# Patient Record
Sex: Female | Born: 1980 | Hispanic: No | Marital: Single | Smoking: Current every day smoker
Health system: Southern US, Community
[De-identification: ages and names within clinical notes are randomized; demographics above are authoritative.]

## PROBLEM LIST (undated history)

## (undated) DIAGNOSIS — M549 Dorsalgia, unspecified: Secondary | ICD-10-CM

## (undated) DIAGNOSIS — G43909 Migraine, unspecified, not intractable, without status migrainosus: Secondary | ICD-10-CM

## (undated) DIAGNOSIS — M5136 Other intervertebral disc degeneration, lumbar region: Secondary | ICD-10-CM

## (undated) DIAGNOSIS — G8929 Other chronic pain: Secondary | ICD-10-CM

## (undated) DIAGNOSIS — F419 Anxiety disorder, unspecified: Secondary | ICD-10-CM

## (undated) HISTORY — PX: BACK SURGERY: SHX140

## (undated) HISTORY — PX: TUBAL LIGATION: SHX77

---

## 2000-01-25 ENCOUNTER — Emergency Department (HOSPITAL_COMMUNITY): Admission: EM | Admit: 2000-01-25 | Discharge: 2000-01-25 | Payer: Self-pay | Admitting: Emergency Medicine

## 2000-03-26 ENCOUNTER — Emergency Department (HOSPITAL_COMMUNITY): Admission: EM | Admit: 2000-03-26 | Discharge: 2000-03-26 | Payer: Self-pay | Admitting: Emergency Medicine

## 2016-01-02 ENCOUNTER — Emergency Department (HOSPITAL_BASED_OUTPATIENT_CLINIC_OR_DEPARTMENT_OTHER)
Admission: EM | Admit: 2016-01-02 | Discharge: 2016-01-02 | Disposition: A | Discharge status: Home / Self Care | Payer: Medicaid - Out of State | Attending: Emergency Medicine | Admitting: Emergency Medicine

## 2016-01-02 ENCOUNTER — Emergency Department (HOSPITAL_BASED_OUTPATIENT_CLINIC_OR_DEPARTMENT_OTHER): Payer: Medicaid - Out of State

## 2016-01-02 ENCOUNTER — Encounter (HOSPITAL_BASED_OUTPATIENT_CLINIC_OR_DEPARTMENT_OTHER): Payer: Self-pay | Admitting: *Deleted

## 2016-01-02 DIAGNOSIS — R2231 Localized swelling, mass and lump, right upper limb: Secondary | ICD-10-CM | POA: Diagnosis not present

## 2016-01-02 DIAGNOSIS — F17210 Nicotine dependence, cigarettes, uncomplicated: Secondary | ICD-10-CM | POA: Diagnosis not present

## 2016-01-02 DIAGNOSIS — M25531 Pain in right wrist: Secondary | ICD-10-CM | POA: Diagnosis present

## 2016-01-02 MED ORDER — KETOROLAC TROMETHAMINE 60 MG/2ML IM SOLN
60.0000 mg | Freq: Once | INTRAMUSCULAR | Status: AC
  Administered 2016-01-02: 60 mg via INTRAMUSCULAR
  Filled 2016-01-02: qty 2

## 2016-01-02 MED ORDER — NAPROXEN 500 MG PO TABS: 500.0000 mg | ORAL_TABLET | Freq: Two times a day (BID) | ORAL | Status: DC

## 2016-01-02 NOTE — Discharge Instructions (Signed)
Call orthopedics if your wrist pain does not improve. Use the resource guide to find a primary care provider in the area. RICE (rest, ice, compression, elevation) for your wrist.   Joint Pain Joint pain, which is also called arthralgia, can be caused by many things. Joint pain often goes away when you follow your health care provider's instructions for relieving pain at home. However, joint pain can also be caused by conditions that require further treatment. Common causes of joint pain include:  Bruising in the area of the joint.  Overuse of the joint.  Wear and tear on the joints that occur with aging (osteoarthritis).  Various other forms of arthritis.  A buildup of a crystal form of uric acid in the joint (gout).  Infections of the joint (septic arthritis) or of the bone (osteomyelitis). Your health care provider may recommend medicine to help with the pain. If your joint pain continues, additional tests may be needed to diagnose your condition. HOME CARE INSTRUCTIONS Watch your condition for any changes. Follow these instructions as directed to lessen the pain that you are feeling.  Take medicines only as directed by your health care provider.  Rest the affected area for as long as your health care provider says that you should. If directed to do so, raise the painful joint above the level of your heart while you are sitting or lying down.  Do not do things that cause or worsen pain.  If directed, apply ice to the painful area:  Put ice in a plastic bag.  Place a towel between your skin and the bag.  Leave the ice on for 20 minutes, 2-3 times per day.  Wear an elastic bandage, splint, or sling as directed by your health care provider. Loosen the elastic bandage or splint if your fingers or toes become numb and tingle, or if they turn cold and blue.  Begin exercising or stretching the affected area as directed by your health care provider. Ask your health care provider what  types of exercise are safe for you.  Keep all follow-up visits as directed by your health care provider. This is important. SEEK MEDICAL CARE IF:  Your pain increases, and medicine does not help.  Your joint pain does not improve within 3 days.  You have increased bruising or swelling.  You have a fever.  You lose 10 lb (4.5 kg) or more without trying. SEEK IMMEDIATE MEDICAL CARE IF:  You are not able to move the joint.  Your fingers or toes become numb or they turn cold and blue.   This information is not intended to replace advice given to you by your health care provider. Make sure you discuss any questions you have with your health care provider.   Document Released: 11/08/2005 Document Revised: 11/29/2014 Document Reviewed: 08/20/2014 Elsevier Interactive Patient Education 2016 [employer].   Emergency Department Resource Guide 1) Find a Doctor and Pay Out of Pocket Although you won't have to find out who is covered by your insurance plan, it is a good idea to ask around and get recommendations. You will then need to call the office and see if the doctor you have chosen will accept you as a new patient and what types of options they offer for patients who are self-pay. Some doctors offer discounts or will set up payment plans for their patients who do not have insurance, but you will need to ask so you aren't surprised when you get to your appointment.  2) Contact Your Local Health Department Not all health departments have doctors that can see patients for sick visits, but many do, so it is worth a call to see if yours does. If you don't know where your local health department is, you can check in your phone book. The CDC also has a tool to help you locate your state's health department, and many state websites also have listings of all of their local health departments.  3) Find a Walk-in Clinic If your illness is not likely to be very severe or complicated, you may want to  try a walk in clinic. These are popping up all over the country in pharmacies, drugstores, and shopping centers. They're usually staffed by nurse practitioners or physician assistants that have been trained to treat common illnesses and complaints. They're usually fairly quick and inexpensive. However, if you have serious medical issues or chronic medical problems, these are probably not your best option.  No Primary Care Doctor: - Call Health Connect at  [phone] - they can help you locate a primary care doctor that  accepts your insurance, provides certain services, etc. - Physician Referral Service- [phone]  Chronic Pain Problems: Organization         Address  Phone   Notes  [facility]  [phone] Patients need to be referred by their primary care doctor.   Medication Assistance: Organization         Address  Phone   Notes  [facility] Medication [facility] [address] [place], [place] [address] [phone] --Must be a resident of [facility] -- Must have NO insurance coverage whatsoever (no Medicaid/ Medicare, etc.) -- The pt. MUST have a primary care doctor that directs their care regularly and follows them in the community   [facility]ist  [phone]   [employer]  [phone]    Agencies that provide inexpensive medical care: Organization         Address  Phone   Notes  [facility]  [phone]   [facility]    [phone]   [facility] [address] [place], [place] [address] [phone]   [facility] [address] [place], [place] [phone]   Planned Parenthood    [phone]   [facility]    [phone]   [facility]  [address], [place] Phone:  [phone], Fax:  [phone] Hours of Operation:  9 am - 6 pm, M-F.  Also accepts Medicaid/Medicare and self-pay.  [facility]  [address], [place] Phone: [phone], Fax: [phone]. Hours of Operation:  8:30 am - 5:30 pm, M-F.  Also accepts Medicaid and self-pay.  [facility] [place] [address], [place] Phone: [phone]   [facility] [address] Natasha Bence [place], [place] [phone], Ext. [phone] Mondays & Thursdays: 7-9 AM.  First 15 patients are seen on a first come, first serve basis.    Medicaid-accepting [facility] Providers:  Organization         Address  Phone   Notes  [facility] [address], [place] [phone] Also accepts self-pay patients.  [facility] [address] Laurell Josephs [place], [place]  [phone]   [facility] [address]  [place] [phone]   [place] [address], [place] [phone]   Lucianne Lei [address], [place]   [phone] Only accepts [place] Access [place] patients after they have their name applied to their card.   Self-Pay (no insurance) in [facility]:  Organization         Address  Phone   Notes  Sickle Cell Patients, [facility] Internal Medicine [place] [phone]   [facility] Urgent Care [place] [phone]   [facility] [place]  [place], [place], [place] [phone]   [facility]/Dr. Osei-Bonsu  [address], [place] or [address], [place] [phone] Phone number for both [place] and [place] locations is the same.  [facility] [address], [place] [phone]   [facility] [address], [place] or [address] [phone]   [facility] [address], [place] [phone], phone; [phone], fax  Sees patients 1st and 3rd Saturday of every month.  Must not qualify for public or private insurance (i.e. Medicaid, Medicare, [place] Health Choice, Veterans' Benefits)  Household income should be no more than 200% of the poverty level The clinic cannot treat you if you are pregnant or think you are pregnant  Sexually transmitted diseases are not treated at the clinic.    Dental Care: Organization         Address  Phone  Notes  [facility] Department of [facility] [place] [phone] Accepts children up to age 77 who are enrolled in [place] or [place]; pregnant women with a Medicaid card; and children who have applied for Medicaid or [place] Health Choice, but were declined, whose parents can pay a reduced fee at time of service.  [facility] Department of [facility]  [address], [place] [phone] Accepts children up to age 63 who are enrolled in [place] or [place]; pregnant women with a Medicaid card; and children who have applied for Medicaid or [place] Health Choice, but were declined, whose parents can pay a reduced fee at time of service.  [facility] Access PROGRAM  [place] [phone] Patients are seen by appointment only. Walk-ins are not accepted. [place] will see patients 22 years of age and older. Monday - Tuesday (8am-5pm) Most Wednesdays (8:30-5pm) $30 per visit, cash only  [facility] Adult Dental Access PROGRAM  [address], [facility] [phone] Patients are seen by appointment only. Walk-ins are not accepted. [place] will see patients 47 years of age and older. One Wednesday Evening (Monthly: Volunteer Based).  $30 per visit, cash only  [place]  [phone] for adults; Children under age 7, call Graduate Pediatric Dentistry at [phone]. Children aged 48-14, please call [phone] to  request a pediatric application.  Dental services are provided in all areas of dental care including fillings, crowns and bridges, complete and partial dentures, implants, gum treatment, root canals, and extractions. Preventive care is also provided. Treatment is provided to both adults and children. Patients are selected via a lottery and there is often a waiting list.   [facility] [address],  [place]  [phone] [email or web address]   [facility] [address] [place], [place] [phone], Ext. [phone] Second and Fourth Thursday of each month, opens at 6:30 AM; Clinic ends at 9 AM.  Patients are seen on a first-come first-served basis, and a limited number are seen during each clinic.   [facility]  [address] Hillard Danker [place], [place] [phone]   Eligibility Requirements You must have lived in [place], [place], or [place] counties for at least the last three months.   You cannot be eligible for state or federal sponsored [employer], including [employer], [place], or [employer].   You generally cannot be eligible for healthcare insurance through your employer.    How to apply: Eligibility screenings are held every Tuesday and Wednesday afternoon from 1:00 pm until 4:00 pm. You do not need an appointment for the interview!  [facility] [address] [place], [place]   [place]  [place] Department  [place]  [phone]    Behavioral Health Resources in the Community: Intensive Outpatient Programs Organization         Address  Phone  Notes  [place]. [address], [place], [place] [phone]   [facility] Outpatient [address], [place], [place]   ADS: [facility] Drug S[facility] [address], [place], [place]   [place] [address]  [place], [place] or [phone]   Substance Abuse Resources Organization         Address  Phone  Notes  Alcohol and Drug Services  [phone]   [place]  [phone]   [facility] [place]   Chinita Pester  [phone]   Residential & Outpatient Substance Abuse Program  [phone]   Psychological Services Organization         Address  Phone  Notes  [facility] [place]  [place]  [phone]   [place] [address] [employer] or [phone]    Mobile Crisis Teams Organization         Address  Phone  Notes  [facility], Mobile Crisis Care Unit  [phone]   [facility]  [address] [place], [place]   [place] Levels [address] [place] [phone]    Self-Help/Support Groups Organization         Address  Phone             Notes  [place]. of [place] - variety of support groups  [place] Call for more information  Narcotics Anonymous (NA), [facility] [address], [employer] [place]  2 meetings at this location   Special educational needs teacher         Address  Phone  Notes  [facility] [place],    [place]  [phone]   [facility]  [address] [place] [phone], [place], [place]   [place], [place] [phone] Admissions: 8am-3pm M-F  Incentives Substance [place] [address],    [place], [place] [phone]   [facility] [address] [place], [place], [place]   [facility] [address]  [place], [place]   [facility]ight  Programs - Intensive Outpatient [address] Krist[address], [place], [place]   [facility] ([address]  [place], [place] [phone] or [phone]   [facility] (RTS) [address] [place], [place] Accepts Medicaid  [facility] [place] [address],  [place] [phone] Substance Abuse/Addiction Treatment   [facility] Organization         Address  Phone  Notes  [facility]  [phone]   Domenic Schwab, PhD [address] Arlis Porta [place], [place]   [phone] or [phone]   Lostine Hardtner Donnelly [place], [place] [phone]   [place] [address], [place], [place] [phone] Insurance/Medicaid/sponsorship through [facility] and Families [address]                                    [place], [place] [phone] [place] [address] [place], [place] [phone]    Dr. Adele Schilder  [phone]   [facility] Dept. 1) [address], [place] 2) [place] 3)  [address], [place] [phone]  [phone]   [place] [phone] or [phone] (After Hours)

## 2016-01-02 NOTE — ED Provider Notes (Signed)
CSN: [id number]     Arrival date & time 01/02/16  1825 History   First MD Initiated Contact with Patient 01/02/16 1837     Chief Complaint  Patient presents with  . Wrist Pain   Patient is a 35 y.o. female presenting with wrist pain. The history is provided by the patient.  Wrist Pain This is a new problem. The current episode started yesterday. The problem occurs constantly. The problem has been unchanged. Associated symptoms include arthralgias and joint swelling. Pertinent negatives include no numbness. The symptoms are aggravated by bending. She has tried acetaminophen for the symptoms. The treatment provided no relief.   Abigail Nixon is a 35 year old female presenting with wrist pain. Onset of symptoms was 2 days ago. She states that she drove from [place] to [place] 2 days ago when the wrist pain started. She states that she is holding the steering wheel with her right hand the entire drive. She is not complaining of severe, sharp wrist pain over the ulnar aspect. Pain is exacerbated by movement of the wrist. Pain is somewhat alleviated by holding the wrist still and resting it. She has tried Tylenol without relief. She notes associated wrist swelling. She denies direct injury to the wrist. She denies numbness, tingling, weakness or loss of sensation in the right hand. She denies pain in the digits or elbow. She has no other complaints today.  History reviewed. No pertinent past medical history. Past Surgical History  Procedure Laterality Date  . Back surgery     No family history on file. Social History  Substance Use Topics  . Smoking status: Current Every Day Smoker -- 1.00 packs/day    Types: Cigarettes  . Smokeless tobacco: None  . Alcohol Use: No   OB History    No data available     Review of Systems  Musculoskeletal: Positive for joint swelling and arthralgias.  Neurological: Negative for numbness.  All other systems reviewed and are negative.     Allergies   Review of patient's allergies indicates no known allergies.  Home Medications   Prior to Admission medications   Medication Sig Start Date End Date Taking? Authorizing Provider  naproxen (NAPROSYN) 500 MG tablet Take 1 tablet (500 mg total) by mouth 2 (two) times daily. 01/02/16   Rosibel Giacobbe, PA-C   BP 136/82 mmHg  Pulse 70  Temp(Src) 98.2 F (36.8 C) (Oral)  Resp 20  Ht  (1.676 m)  Wt 99.791 kg  BMI 35.53 kg/m2  SpO2 100%  LMP 12/27/2015 Physical Exam  Constitutional: She appears well-developed and well-nourished. No distress.  HENT:  Head: Normocephalic and atraumatic.  Eyes: Conjunctivae are normal. Right eye exhibits no discharge. Left eye exhibits no discharge. No scleral icterus.  Neck: Normal range of motion.  Cardiovascular: Normal rate, regular rhythm and intact distal pulses.   Pulmonary/Chest: Effort normal. No respiratory distress.  Musculoskeletal:       Right wrist: She exhibits decreased range of motion and tenderness. She exhibits no swelling and no deformity.  Generalized tenderness of the right wrist. Tenderness does not extend into the hand or forearm. Decreased range of motion secondary to pain. No obvious swelling or deformity. Full range of motion of the digits and elbow intact. Radial pulse palpable.  Neurological: She is alert. Coordination normal.  Patient refuses strength testing of the right hand secondary to pain. Sensation to light touch intact over the right arm  Skin: Skin is warm and dry.  Psychiatric: She has a normal mood and affect. Her behavior is normal.  Nursing note and vitals reviewed.   ED Course  Procedures (including critical care time) Labs Review Labs Reviewed - No data to display  Imaging Review Dg Wrist Complete Right  01/02/2016  CLINICAL DATA:  Right wrist pain for 2 days.  No known injury. EXAM: RIGHT WRIST - COMPLETE 3+ VIEW COMPARISON:  None. FINDINGS: There is no evidence of fracture or dislocation. There is no  evidence of arthropathy or other focal bone abnormality. Soft tissues are unremarkable. IMPRESSION: Negative. Electronically Signed   By: Bary Richard M.D.   On: 01/02/2016 18:52   I have personally reviewed and evaluated these images and lab results as part of my medical decision-making.   EKG Interpretation None      MDM   Final diagnoses:  Right wrist pain   Patient presenting with atraumatic right wrist pain after a long drive. Right hand is neurovascularly intact. Restricted ROM secondary to pain. Patient X-Ray negative for obvious fracture or dislocation. Pain managed in ED with toradol. Brace given and conservative therapy recommended. Discussed RICE therapy and use of OTC pain relievers. Pt advised to follow up with orthopedics if symptoms persist. Return precautions discussed at bedside and given in discharge paperwork. Pt is stable for discharge.     Alveta Heimlich, PA-C 01/02/16 1953  Glynn Octave, MD 01/02/16 2004

## 2016-01-02 NOTE — ED Notes (Signed)
Right wrist pain x 2 days. No known injury.

## 2016-03-05 ENCOUNTER — Encounter (HOSPITAL_BASED_OUTPATIENT_CLINIC_OR_DEPARTMENT_OTHER): Payer: Self-pay | Admitting: *Deleted

## 2016-03-05 ENCOUNTER — Emergency Department (HOSPITAL_BASED_OUTPATIENT_CLINIC_OR_DEPARTMENT_OTHER)
Admission: EM | Admit: 2016-03-05 | Discharge: 2016-03-05 | Disposition: A | Discharge status: Home / Self Care | Payer: Medicaid - Out of State | Attending: Emergency Medicine | Admitting: Emergency Medicine

## 2016-03-05 DIAGNOSIS — F17210 Nicotine dependence, cigarettes, uncomplicated: Secondary | ICD-10-CM | POA: Insufficient documentation

## 2016-03-05 DIAGNOSIS — N764 Abscess of vulva: Secondary | ICD-10-CM | POA: Diagnosis present

## 2016-03-05 MED ORDER — LIDOCAINE-EPINEPHRINE 2 %-1:100000 IJ SOLN
INTRAMUSCULAR | Status: AC
  Administered 2016-03-05: 10 mL
  Filled 2016-03-05: qty 1

## 2016-03-05 MED ORDER — OXYCODONE-ACETAMINOPHEN 5-325 MG PO TABS: 1.0000 | ORAL_TABLET | ORAL | Status: DC | PRN

## 2016-03-05 MED ORDER — OXYCODONE-ACETAMINOPHEN 5-325 MG PO TABS
1.0000 | ORAL_TABLET | Freq: Once | ORAL | Status: AC
  Administered 2016-03-05: 1 via ORAL
  Filled 2016-03-05: qty 1

## 2016-03-05 MED ORDER — LIDOCAINE-EPINEPHRINE (PF) 2 %-1:200000 IJ SOLN: 10.0000 mL | Freq: Once | INTRAMUSCULAR | Status: DC

## 2016-03-05 MED ORDER — CEPHALEXIN 500 MG PO CAPS: 500.0000 mg | ORAL_CAPSULE | Freq: Four times a day (QID) | ORAL | Status: DC

## 2016-03-05 MED ORDER — IBUPROFEN 800 MG PO TABS: 800.0000 mg | ORAL_TABLET | Freq: Three times a day (TID) | ORAL | Status: DC

## 2016-03-05 NOTE — ED Provider Notes (Signed)
CSN: [id number]     Arrival date & time 03/05/16  2028 History   First MD Initiated Contact with Patient 03/05/16 2041     Chief Complaint  Patient presents with  . Abscess     (Consider location/radiation/quality/duration/timing/severity/associated sxs/prior Treatment) Patient is a 35 y.o. female presenting with abscess. The history is provided by the patient. No language interpreter was used.  Abscess Location:  Ano-genital Ano-genital abscess location:  Vagina Abscess quality: painful and redness   Abscess quality: not draining   Red streaking: no   Duration:  5 days Progression:  Worsening Chronicity:  Recurrent Associated symptoms: no fever   Associated symptoms comment:  Patient with a history of recurrent cutaneous abscesses presents with painful swelling like her boils to left labia. No drainage or fever. She states the pain makes her nauseous but she denies vomiting. No vaginal discharge or dysuria.    History reviewed. No pertinent past medical history. Past Surgical History  Procedure Laterality Date  . Back surgery    . Tubal ligation     No family history on file. Social History  Substance Use Topics  . Smoking status: Current Every Day Smoker -- 1.00 packs/day    Types: Cigarettes  . Smokeless tobacco: None  . Alcohol Use: No   OB History    No data available     Review of Systems  Constitutional: Negative for fever.  Gastrointestinal: Negative for abdominal pain.  Genitourinary: Positive for vaginal pain.  Musculoskeletal: Negative for myalgias.  Skin: Negative for color change.       C/O recurrent boil to left labia majora.      Allergies  Review of patient's allergies indicates no known allergies.  Home Medications   Prior to Admission medications   Not on File   BP 151/87 mmHg  Pulse 76  Temp(Src) 98.9 F (37.2 C)  Resp 16  Ht 5\' 6"  (1.676 m)  Wt 102.059 kg  BMI 36.33 kg/m2  SpO2 99%  LMP 02/21/2016 Physical Exam   Constitutional: She is oriented to person, place, and time. She appears well-developed and well-nourished.  Neck: Normal range of motion.  Pulmonary/Chest: Effort normal.  Abdominal: There is no tenderness.  Genitourinary:  Large indurated cyst to upper labia majora on left. No fluctuance, no redness.   Neurological: She is alert and oriented to person, place, and time.  Skin: Skin is warm and dry.    ED Course  Procedures (including critical care time) Labs Review Labs Reviewed - No data to display  Imaging Review No results found. I have personally reviewed and evaluated these images and lab results as part of my medical decision-making.   EKG Interpretation None     INCISION AND DRAINAGE Performed by: Elpidio AnisUPSTILL, Natalya Domzalski A Consent: Verbal consent obtained. Risks and benefits: risks, benefits and alternatives were discussed Type: abscess  Body area: left outer labia  Anesthesia: local infiltration  Incision was made with a #11 blade scalpel.  Local anesthetic: lidocaine 2% w/epinephrine  Anesthetic total: 2 ml  Complexity: complex Blunt dissection to break up loculations  Drainage: purulent  Drainage amount: none  Packing material: none  Patient tolerance: Patient tolerated the procedure well with no immediate complications.    MDM   Final diagnoses:  None    1. Labial abscess  Discussed with the patient that the boil may not be amenable to drainage given no redness or fluctuance. The patient prefers attempt at I&D which was performed. No drainage with incision. Will  place on abx, recommend warm compresses and 2 day follow up. Pain mgmt provided.     Elpidio Anis, PA-C 03/05/16 2202  Alvira Monday, MD 03/07/16 [phone]

## 2016-03-05 NOTE — ED Notes (Signed)
Discharge vitals taken, BP elevated patient states she is in a lot of pain but that is normal history of abcesses.

## 2016-03-05 NOTE — ED Notes (Signed)
Pt c/o abscess to labia x 1 week

## 2016-03-05 NOTE — Discharge Instructions (Signed)
Incision and Drainage °Incision and drainage is a procedure in which a sac-like structure (cystic structure) is opened and drained. The area to be drained usually contains material such as pus, fluid, or blood.  °LET YOUR CAREGIVER KNOW ABOUT:  °· Allergies to medicine. °· Medicines taken, including vitamins, herbs, eyedrops, over-the-counter medicines, and creams. °· Use of steroids (by mouth or creams). °· Previous problems with anesthetics or numbing medicines. °· History of bleeding problems or blood clots. °· Previous surgery. °· Other health problems, including diabetes and kidney problems. °· Possibility of pregnancy, if this applies. °RISKS AND COMPLICATIONS °· Pain. °· Bleeding. °· Scarring. °· Infection. °BEFORE THE PROCEDURE  °You may need to have an ultrasound or other imaging tests to see how large or deep your cystic structure is. Blood tests may also be used to determine if you have an infection or how severe the infection is. You may need to have a tetanus shot. °PROCEDURE  °The affected area is cleaned with a cleaning fluid. The cyst area will then be numbed with a medicine (local anesthetic). A small incision will be made in the cystic structure. A syringe or catheter may be used to drain the contents of the cystic structure, or the contents may be squeezed out. The area will then be flushed with a cleansing solution. After cleansing the area, it is often gently packed with a gauze or another wound dressing. Once it is packed, it will be covered with gauze and tape or some other type of wound dressing.  °AFTER THE PROCEDURE  °· Often, you will be allowed to go home right after the procedure. °· You may be given antibiotic medicine to prevent or heal an infection. °· If the area was packed with gauze or some other wound dressing, you will likely need to come back in 1 to 2 days to get it removed. °· The area should heal in about 14 days. °  °This information is not intended to replace advice given  to you by your health care provider. Make sure you discuss any questions you have with your health care provider. °  °Document Released: 05/04/2001 Document Revised: 05/09/2012 Document Reviewed: 01/03/2012 °Elsevier Interactive Patient Education ©2016 [employer] °Heat Therapy °Heat therapy can help ease sore, stiff, injured, and tight muscles and joints. Heat relaxes your muscles, which may help ease your pain.  °RISKS AND COMPLICATIONS °If you have any of the following conditions, do not use heat therapy unless your health care provider has approved: °· Poor circulation. °· Healing wounds or scarred skin in the area being treated. °· Diabetes, heart disease, or high blood pressure. °· Not being able to feel (numbness) the area being treated. °· Unusual swelling of the area being treated. °· Active infections. °· Blood clots. °· Cancer. °· Inability to communicate pain. This may include young children and people who have problems with their brain function (dementia). °· Pregnancy. °Heat therapy should only be used on old, pre-existing, or long-lasting (chronic) injuries. Do not use heat therapy on new injuries unless directed by your health care provider. °HOW TO USE HEAT THERAPY °There are several different kinds of heat therapy, including: °· Moist heat pack. °· Warm water bath. °· Hot water bottle. °· Electric heating pad. °· Heated gel pack. °· Heated wrap. °· Electric heating pad. °Use the heat therapy method suggested by your health care provider. Follow your health care provider's instructions on when and how to use heat therapy. °GENERAL HEAT THERAPY RECOMMENDATIONS °· Do   not sleep while using heat therapy. Only use heat therapy while you are awake. °· Your skin may turn pink while using heat therapy. Do not use heat therapy if your skin turns red. °· Do not use heat therapy if you have new pain. °· High heat or long exposure to heat can cause burns. Be careful when using heat therapy to avoid burning  your skin. °· Do not use heat therapy on areas of your skin that are already irritated, such as with a rash or sunburn. °SEEK MEDICAL CARE IF: °· You have blisters, redness, swelling, or numbness. °· You have new pain. °· Your pain is worse. °MAKE SURE YOU: °· Understand these instructions. °· Will watch your condition. °· Will get help right away if you are not doing well or get worse. °  °This information is not intended to replace advice given to you by your health care provider. Make sure you discuss any questions you have with your health care provider. °  °Document Released: 01/31/2012 Document Revised: 11/29/2014 Document Reviewed: 01/01/2014 °Elsevier Interactive Patient Education ©2016 [employer]vier Inc. ° °

## 2016-03-08 ENCOUNTER — Emergency Department (HOSPITAL_BASED_OUTPATIENT_CLINIC_OR_DEPARTMENT_OTHER)
Admission: EM | Admit: 2016-03-08 | Discharge: 2016-03-08 | Disposition: A | Discharge status: Home / Self Care | Payer: Medicaid - Out of State | Attending: Emergency Medicine | Admitting: Emergency Medicine

## 2016-03-08 ENCOUNTER — Encounter (HOSPITAL_BASED_OUTPATIENT_CLINIC_OR_DEPARTMENT_OTHER): Payer: Self-pay

## 2016-03-08 DIAGNOSIS — N764 Abscess of vulva: Secondary | ICD-10-CM | POA: Diagnosis not present

## 2016-03-08 DIAGNOSIS — F17210 Nicotine dependence, cigarettes, uncomplicated: Secondary | ICD-10-CM | POA: Insufficient documentation

## 2016-03-08 DIAGNOSIS — Z792 Long term (current) use of antibiotics: Secondary | ICD-10-CM | POA: Insufficient documentation

## 2016-03-08 DIAGNOSIS — R102 Pelvic and perineal pain: Secondary | ICD-10-CM | POA: Diagnosis present

## 2016-03-08 DIAGNOSIS — Z791 Long term (current) use of non-steroidal anti-inflammatories (NSAID): Secondary | ICD-10-CM | POA: Insufficient documentation

## 2016-03-08 MED ORDER — LIDOCAINE HCL 1 % IJ SOLN
INTRAMUSCULAR | Status: AC
  Administered 2016-03-08: 20 mL
  Filled 2016-03-08: qty 20

## 2016-03-08 MED ORDER — SULFAMETHOXAZOLE-TRIMETHOPRIM 800-160 MG PO TABS: 1.0000 | ORAL_TABLET | Freq: Two times a day (BID) | ORAL | Status: AC

## 2016-03-08 MED ORDER — OXYCODONE-ACETAMINOPHEN 5-325 MG PO TABS: 1.0000 | ORAL_TABLET | Freq: Four times a day (QID) | ORAL | Status: DC | PRN

## 2016-03-08 MED ORDER — MORPHINE SULFATE (PF) 4 MG/ML IV SOLN
4.0000 mg | Freq: Once | INTRAVENOUS | Status: AC
  Administered 2016-03-08: 4 mg via INTRAMUSCULAR
  Filled 2016-03-08: qty 1

## 2016-03-08 NOTE — ED Notes (Signed)
Pa  at bedside. 

## 2016-03-08 NOTE — ED Provider Notes (Signed)
CSN: [id number]     Arrival date & time 03/08/16  1702 History   First MD Initiated Contact with Patient 03/08/16 1921     Chief Complaint  Patient presents with  . Cyst     (Consider location/radiation/quality/duration/timing/severity/associated sxs/prior Treatment) The history is provided by the patient and medical records. No language interpreter was used.   Claudean Kindsheresa Petrovich is a 35 y.o. female  with a PMH of multiple skin abscesses in various locations who presents to the Emergency Department complaining of a worsening vaginal abscess with associated vaginal pain x 8-9 days. Patient was seen in ED on 4/14 (3 days ago) for the same where an I&D was performed but appears no drainage with procedure. She was started on Keflex which she has been compliant with, however area has worsened. Today, she noticed purulent drainage from the site. Patient states short course of pain medication was given at last ED visit, however she has ran out and has taken nothing else for the pain today. No alleviating or aggravating factors noted. Patient denies fever, vaginal discharge, vaginal bleeding.   History reviewed. No pertinent past medical history. Past Surgical History  Procedure Laterality Date  . Back surgery    . Tubal ligation     No family history on file. Social History  Substance Use Topics  . Smoking status: Current Every Day Smoker -- 1.00 packs/day    Types: Cigarettes  . Smokeless tobacco: None  . Alcohol Use: No   OB History    No data available     Review of Systems  Constitutional: Negative for fever.  Genitourinary: Positive for vaginal pain. Negative for vaginal bleeding and vaginal discharge.  Skin: Positive for wound.   10 Systems reviewed and are negative for acute change except as noted in the HPI.   Allergies  Review of patient's allergies indicates no known allergies.  Home Medications   Prior to Admission medications   Medication Sig Start Date End Date Taking?  Authorizing Provider  cephALEXin (KEFLEX) 500 MG capsule Take 1 capsule (500 mg total) by mouth 4 (four) times daily. 03/05/16   Elpidio AnisShari Upstill, PA-C  ibuprofen (ADVIL,MOTRIN) 800 MG tablet Take 1 tablet (800 mg total) by mouth 3 (three) times daily. 03/05/16   Elpidio AnisShari Upstill, PA-C  oxyCODONE-acetaminophen (PERCOCET/ROXICET) 5-325 MG tablet Take 1 tablet by mouth every 6 (six) hours as needed for severe pain. 03/08/16   Chase PicketJaime Pilcher Alexus Michael, PA-C  sulfamethoxazole-trimethoprim (BACTRIM DS,SEPTRA DS) 800-160 MG tablet Take 1 tablet by mouth 2 (two) times daily. 03/08/16 03/15/16  Dane Kopke Pilcher Rayshard Schirtzinger, PA-C   BP 121/68 mmHg  Pulse 89  Temp(Src) 98.8 F (37.1 C) (Oral)  Resp 16  Ht 5\' 6"  (1.676 m)  Wt 102.059 kg  BMI 36.33 kg/m2  SpO2 97%  LMP 02/21/2016 Physical Exam  Constitutional: She is oriented to person, place, and time. She appears well-developed and well-nourished.  Alert, appears in pain, but in no acute distress.  HENT:  Head: Normocephalic and atraumatic.  Cardiovascular: Normal rate, regular rhythm and normal heart sounds.  Exam reveals no gallop and no friction rub.   No murmur heard. Pulmonary/Chest: Effort normal and breath sounds normal. No respiratory distress. She has no wheezes. She has no rales.  Abdominal: Soft. She exhibits no distension and no mass. There is no tenderness. There is no rebound and no guarding.  Genitourinary:  Upper left labia majora with erythematous area of fluctuance slightly draining purulent fluid with a large amount of induration without  erythema surrounding.   Neurological: She is alert and oriented to person, place, and time.  Skin: Skin is warm and dry.  Nursing note and vitals reviewed.   ED Course  Procedures (including critical care time)  INCISION AND DRAINAGE Performed by: Chase Picket Dhyana Bastone Consent: Verbal consent obtained. Risks and benefits: risks, benefits and alternatives were discussed Type: abscess Body area: left outer  labia Anesthesia: local infiltration Incision was made with a scalpel. Local anesthetic: lidocaine 1%  Anesthetic total: 1 ml - Patient asked me to quit local Anesthetic infiltrate secondary to pain, therefore did not fully anesthetize the area and only 1 mL was used. Complexity: complex Blunt dissection to break up loculations Drainage: purulent Drainage amount: Large Packing material: I recommended packing, however patient would not allow me to put packing in place. Recommended again, patient still declined.  Patient tolerance: Patient tolerated the procedure well with no immediate complications.   Labs Review Labs Reviewed - No data to display  Imaging Review No results found. I have personally reviewed and evaluated these images and lab results as part of my medical decision-making.   EKG Interpretation None      MDM   Final diagnoses:  Left genital labial abscess   Monetta Lick is a 35 y.o. female who presents to ED with vaginal abscess. On exam, she has a large area of fluctuance on the left outer labia which needs to be drained. I and D was performed as dictated above with a large amount of drainage. Patient was placed on Keflex when seen for the same 3 days ago. Will switch antibiotic to Bactrim. Patient was given referral to GYN. Return precautions were given, home care instructions discussed, and all questions were answered.  Patient discussed with Dr. Karma Ganja who agrees with treatment plan.   [facility] Zerick Prevette, PA-C 03/09/16 4098  Jerelyn Scott, MD 03/09/16 [phone]

## 2016-03-08 NOTE — Discharge Instructions (Signed)
You need to follow up with the surgeon listed at the next available appointment, preferably in the next 3 days. Take pain medication only as needed for severe pain - This can make you very drowsy - please do not drink or drive on this medication Return to ER for fever, any new or worsening symptoms, any additional concerns.

## 2016-03-08 NOTE — ED Notes (Signed)
C/o vaginal "cyst"-seen here 3 days ago for same

## 2016-09-03 ENCOUNTER — Ambulatory Visit (HOSPITAL_COMMUNITY)
Admission: EM | Admit: 2016-09-03 | Discharge: 2016-09-03 | Disposition: A | Discharge status: Other Acute Inpatient Hospital | Payer: Medicaid - Out of State

## 2016-09-09 ENCOUNTER — Emergency Department (HOSPITAL_COMMUNITY): Payer: Medicaid Other

## 2016-09-09 ENCOUNTER — Encounter (HOSPITAL_COMMUNITY): Payer: Self-pay | Admitting: Emergency Medicine

## 2016-09-09 ENCOUNTER — Emergency Department (HOSPITAL_COMMUNITY)
Admission: EM | Admit: 2016-09-09 | Discharge: 2016-09-09 | Disposition: A | Discharge status: Home / Self Care | Payer: Medicaid Other | Attending: Emergency Medicine | Admitting: Emergency Medicine

## 2016-09-09 DIAGNOSIS — Y929 Unspecified place or not applicable: Secondary | ICD-10-CM | POA: Insufficient documentation

## 2016-09-09 DIAGNOSIS — S7001XA Contusion of right hip, initial encounter: Secondary | ICD-10-CM | POA: Insufficient documentation

## 2016-09-09 DIAGNOSIS — W19XXXA Unspecified fall, initial encounter: Secondary | ICD-10-CM

## 2016-09-09 DIAGNOSIS — W0110XA Fall on same level from slipping, tripping and stumbling with subsequent striking against unspecified object, initial encounter: Secondary | ICD-10-CM | POA: Diagnosis not present

## 2016-09-09 DIAGNOSIS — F17210 Nicotine dependence, cigarettes, uncomplicated: Secondary | ICD-10-CM | POA: Insufficient documentation

## 2016-09-09 DIAGNOSIS — S79911A Unspecified injury of right hip, initial encounter: Secondary | ICD-10-CM | POA: Diagnosis present

## 2016-09-09 DIAGNOSIS — M542 Cervicalgia: Secondary | ICD-10-CM | POA: Insufficient documentation

## 2016-09-09 DIAGNOSIS — K029 Dental caries, unspecified: Secondary | ICD-10-CM | POA: Insufficient documentation

## 2016-09-09 DIAGNOSIS — Y939 Activity, unspecified: Secondary | ICD-10-CM | POA: Insufficient documentation

## 2016-09-09 DIAGNOSIS — G8929 Other chronic pain: Secondary | ICD-10-CM | POA: Insufficient documentation

## 2016-09-09 DIAGNOSIS — Y999 Unspecified external cause status: Secondary | ICD-10-CM | POA: Insufficient documentation

## 2016-09-09 DIAGNOSIS — M545 Low back pain: Secondary | ICD-10-CM | POA: Diagnosis not present

## 2016-09-09 DIAGNOSIS — K0889 Other specified disorders of teeth and supporting structures: Secondary | ICD-10-CM

## 2016-09-09 HISTORY — DX: Other intervertebral disc degeneration, lumbar region: M51.36

## 2016-09-09 HISTORY — DX: Migraine, unspecified, not intractable, without status migrainosus: G43.909

## 2016-09-09 LAB — POC URINE PREG, ED: Preg Test, Ur: NEGATIVE

## 2016-09-09 MED ORDER — HYDROMORPHONE HCL 1 MG/ML IJ SOLN
1.0000 mg | Freq: Once | INTRAMUSCULAR | Status: AC
  Administered 2016-09-09: 1 mg via INTRAVENOUS
  Filled 2016-09-09: qty 1

## 2016-09-09 MED ORDER — DIAZEPAM 5 MG/ML IJ SOLN
5.0000 mg | Freq: Once | INTRAMUSCULAR | Status: AC
  Administered 2016-09-09: 5 mg via INTRAVENOUS
  Filled 2016-09-09: qty 2

## 2016-09-09 MED ORDER — OXYCODONE-ACETAMINOPHEN 10-325 MG PO TABS: 1.0000 | ORAL_TABLET | ORAL | 0 refills | Status: AC | PRN

## 2016-09-09 MED ORDER — PENICILLIN V POTASSIUM 500 MG PO TABS: 500.0000 mg | ORAL_TABLET | Freq: Four times a day (QID) | ORAL | 0 refills | Status: AC

## 2016-09-09 MED ORDER — LIDOCAINE VISCOUS 2 % MT SOLN: 15.0000 mL | OROMUCOSAL | 0 refills | Status: DC | PRN

## 2016-09-09 MED ORDER — LIDOCAINE VISCOUS 2 % MT SOLN
15.0000 mL | Freq: Once | OROMUCOSAL | Status: AC
  Administered 2016-09-09: 15 mL via OROMUCOSAL
  Filled 2016-09-09: qty 15

## 2016-09-09 NOTE — ED Notes (Signed)
PT state she is in pain and need to see a MD. Info PT I will ask RN if any med have been order. PT went back in rm to close door

## 2016-09-09 NOTE — ED Provider Notes (Signed)
[facility]-EMERGENCY DEPT Provider Note   CSN: [id number] Arrival date & time: 09/09/16  1052     History   Chief Complaint Chief Complaint  Patient presents with  . Multiple Complaints    HPI Abigail Nixon is a 35 y.o. female.  HPI  Abigail Nixon is a 35 y.o. female with PMH significant for chronic back pain with previous back surgeries who presents for evaluation of fall.  Patient is disabled due to her back pain.  Currently taking percocet 10-325 mg, but ran out over the last couple of days.  Recently moved here from [place] 2 months ago.  Ambulates with cane.  She states she was in the shower and lost her balance causing her to fall, landing on her right hip and striking the right side of her head.  Unsure LOC.  Denies preceding symptoms including CP, SOB, dizziness, or syncope.  Denies fever, chills, diplopia, N/V, numbness, weakness, b/b incontinence.  She states she has had tingling in left arm to all fingertips for the last 3 weeks.  [facility]. She also complains of right upper and lower dental pain x 1 week.  Has been using oragel with relief.  She does not have a dentist. No tongue swelling, anterior neck pain/swelling.  Mild facial swelling.   Past Medical History:  Diagnosis Date  . DDD (degenerative disc disease), lumbar   . Migraine     There are no active problems to display for this patient.   Past Surgical History:  Procedure Laterality Date  . BACK SURGERY    . TUBAL LIGATION      OB History    No data available       Home Medications    Prior to Admission medications   Medication Sig Start Date End Date Taking? Authorizing Provider  albuterol (PROVENTIL HFA;VENTOLIN HFA) 108 (90 Base) MCG/ACT inhaler Inhale 1-2 puffs into the lungs every 6 (six) hours as needed for wheezing or shortness of breath.    Yes Historical Provider, MD  albuterol (PROVENTIL) (2.5 MG/3ML) 0.083% nebulizer solution Take 2.5 mg by  nebulization every 6 (six) hours as needed for wheezing or shortness of breath.   Yes Historical Provider, MD  lidocaine (XYLOCAINE) 2 % solution Use as directed 15 mLs in the mouth or throat as needed for mouth pain. 09/09/16   Cheri FowlerKayla Tradarius Reinwald, PA-C  oxyCODONE-acetaminophen (PERCOCET) 10-325 MG tablet Take 1 tablet by mouth every 4 (four) hours as needed for pain. 09/09/16   Cheri FowlerKayla Anna Livers, PA-C  penicillin v potassium (VEETID) 500 MG tablet Take 1 tablet (500 mg total) by mouth 4 (four) times daily. 09/09/16 09/16/16  Cheri FowlerKayla Caramia Boutin, PA-C    Family History History reviewed. No pertinent family history.  Social History Social History  Substance Use Topics  . Smoking status: Current Every Day Smoker    Packs/day: 1.00    Types: Cigarettes  . Smokeless tobacco: Never Used  . Alcohol use No     Allergies   Review of patient's allergies indicates no known allergies.   Review of Systems Review of Systems   Physical Exam Updated Vital Signs BP 125/87   Pulse 65   Temp 98.9 F (37.2 C) (Oral)   Resp 16   LMP 08/26/2016 (Approximate)   SpO2 98%   Physical Exam  Constitutional: She is oriented to person, place, and time. She appears well-developed and well-nourished.  Non-toxic appearance. She does not have a sickly appearance. She does not  appear ill.  Patient extremely tearful and anxious on exam.   HENT:  Head: Normocephalic and atraumatic.  Mouth/Throat: Uvula is midline, oropharynx is clear and moist and mucous membranes are normal. No trismus in the jaw. Abnormal dentition. Dental caries present.  Right parietal scalp TTP without crepitus or hematoma. Multiple missing teeth.  No obvious dental abscess.  Mild left sided facial swelling with mild maxilla and mandibular tenderness. No trismus.  No signs of Ludwig angina.   Eyes: Conjunctivae are normal. Pupils are equal, round, and reactive to light.  Neck: Normal range of motion. Neck supple.  Diffuse cervical midline tenderness.     Cardiovascular: Normal rate, regular rhythm and normal heart sounds.   Pulmonary/Chest: Effort normal and breath sounds normal. No accessory muscle usage or stridor. No respiratory distress. She has no wheezes. She has no rhonchi. She has no rales.  Abdominal: Soft. Bowel sounds are normal. She exhibits no distension. There is no tenderness.  Musculoskeletal: Normal range of motion. She exhibits tenderness.       Right hip: She exhibits tenderness. She exhibits normal range of motion and normal strength.  Moves all extremities spontaneously.  Lymphadenopathy:    She has no cervical adenopathy.  Neurological: She is alert and oriented to person, place, and time.  Speech clear without dysarthria. Cranial nerves grossly intact. Normal strength and sensation intact bilaterally throughout upper and lower extremities.  Normal gait with cane.  Skin: Skin is warm and dry.  Well healed lumbar surgical scar without signs of infection.  Ecchymosis to right lateral hip.   Psychiatric: She has a normal mood and affect. Her behavior is normal.     ED Treatments / Results  Labs (all labs ordered are listed, but only abnormal results are displayed) Labs Reviewed  POC URINE PREG, ED    EKG  EKG Interpretation None       Radiology Ct Head Wo Contrast  Result Date: 09/09/2016 CLINICAL DATA:  Fall, head injury EXAM: CT HEAD WITHOUT CONTRAST CT CERVICAL SPINE WITHOUT CONTRAST TECHNIQUE: Multidetector CT imaging of the head and cervical spine was performed following the standard protocol without intravenous contrast. Multiplanar CT image reconstructions of the cervical spine were also generated. COMPARISON:  None. FINDINGS: CT HEAD FINDINGS Brain: No intracranial hemorrhage, mass effect or midline shift. No acute cortical infarction. No hydrocephalus. The gray and white-matter differentiation is preserved. No mass lesion is noted on this unenhanced scan. Vascular: No hyperdense vessel or unexpected  calcification. Skull: Normal. Negative for fracture or focal lesion. Sinuses/Orbits: No acute finding. Other: None CT CERVICAL SPINE FINDINGS Alignment: Mild reversal of cervical lordosis. Skull base and vertebrae: No acute fracture or subluxation. Mild disc space flattening with anterior spurring at C5-C6 and C6-C7 level. Mild anterior spurring at T1-T2 and T2-T3 level. Soft tissues and spinal canal: No prevertebral soft tissue swelling. Spinal canal is patent. Disc levels: Mild disc space flattening with anterior spurring at C5-C6 and C6-C7 level. Upper chest: Visualized lung apices shows no evidence of pneumothorax. Other: There is old fracture deformity of proximal left clavicle. There is probable chronic subluxation and depression of proximal left clavicle from sternoclavicular joint. Please see axial image 75. IMPRESSION: 1. No acute intracranial abnormality. 2. No cervical spine acute fracture or subluxation. Mild degenerative changes as described above. 3. There is old fracture deformity of proximal left clavicle. There is probable chronic subluxation and depression of proximal left clavicle from sternoclavicular joint. Please see axial image 75. Clinical correlation is necessary. Electronically  Signed   By: Natasha Mead M.D.   On: 09/09/2016 13:50   Ct Cervical Spine Wo Contrast  Result Date: 09/09/2016 CLINICAL DATA:  Fall, head injury EXAM: CT HEAD WITHOUT CONTRAST CT CERVICAL SPINE WITHOUT CONTRAST TECHNIQUE: Multidetector CT imaging of the head and cervical spine was performed following the standard protocol without intravenous contrast. Multiplanar CT image reconstructions of the cervical spine were also generated. COMPARISON:  None. FINDINGS: CT HEAD FINDINGS Brain: No intracranial hemorrhage, mass effect or midline shift. No acute cortical infarction. No hydrocephalus. The gray and white-matter differentiation is preserved. No mass lesion is noted on this unenhanced scan. Vascular: No hyperdense  vessel or unexpected calcification. Skull: Normal. Negative for fracture or focal lesion. Sinuses/Orbits: No acute finding. Other: None CT CERVICAL SPINE FINDINGS Alignment: Mild reversal of cervical lordosis. Skull base and vertebrae: No acute fracture or subluxation. Mild disc space flattening with anterior spurring at C5-C6 and C6-C7 level. Mild anterior spurring at T1-T2 and T2-T3 level. Soft tissues and spinal canal: No prevertebral soft tissue swelling. Spinal canal is patent. Disc levels: Mild disc space flattening with anterior spurring at C5-C6 and C6-C7 level. Upper chest: Visualized lung apices shows no evidence of pneumothorax. Other: There is old fracture deformity of proximal left clavicle. There is probable chronic subluxation and depression of proximal left clavicle from sternoclavicular joint. Please see axial image 75. IMPRESSION: 1. No acute intracranial abnormality. 2. No cervical spine acute fracture or subluxation. Mild degenerative changes as described above. 3. There is old fracture deformity of proximal left clavicle. There is probable chronic subluxation and depression of proximal left clavicle from sternoclavicular joint. Please see axial image 75. Clinical correlation is necessary. Electronically Signed   By: Natasha Mead M.D.   On: 09/09/2016 13:50   Dg Hip Unilat W Or Wo Pelvis 2-3 Views Right  Result Date: 09/09/2016 CLINICAL DATA:  Chronic back pain, recent fall EXAM: DG HIP (WITH OR WITHOUT PELVIS) 2-3V RIGHT COMPARISON:  None. FINDINGS: Both hips appear to be normal in position with normal hip joint spaces. No fracture is seen. The pelvic rami are intact. The SI joints appear normal. Hardware for fusion of the lower lumbar spine is noted. IMPRESSION: No acute fracture.  Hardware for fusion of the lower lumbar spine. Electronically Signed   By: Dwyane Dee M.D.   On: 09/09/2016 14:59    Procedures Procedures (including critical care time)  Medications Ordered in  ED Medications  HYDROmorphone (DILAUDID) injection 1 mg (1 mg Intravenous Given 09/09/16 1307)  diazepam (VALIUM) injection 5 mg (5 mg Intravenous Given 09/09/16 1305)  lidocaine (XYLOCAINE) 2 % viscous mouth solution 15 mL (15 mLs Mouth/Throat Given 09/09/16 1426)  HYDROmorphone (DILAUDID) injection 1 mg (1 mg Intravenous Given 09/09/16 1426)     Initial Impression / Assessment and Plan / ED Course  I have reviewed the triage vital signs and the nursing notes.  Pertinent labs & imaging results that were available during my care of the patient were reviewed by me and considered in my medical decision making (see chart for details).  Clinical Course   Patient with chronic pain presents with fall and exacerbation of chronic pain.  Patient had mechanical fall in the shower and landed on her hip and head.  Has been ambulatory since the fall, baseline with cane.  Normal neurological exam with no focal deficits.  Diffuse cervical midline tenderness and generalized right sided scalp tenderness.  Also, complaining of right sided dental pain, no obvious abscess, mild  right facial swelling.  No systemic symptoms.  No signs of Ludwig angina or deep space infection.  CT head and neck obtained as well as right hip, and these were negative.  She does have mild degenerative changes and old left clavicle fracture.  Patient received 2 mg Diluadid and viscous lidocaine. Reviewed [facility] Controlled Substance Reporting System and patient has no active prescriptions. Upon multiple reassessments, patient sitting comfortably in bed and immediately starts moaning and becomes tearful when nurse or myself enters the room.  Patient feels improved.  Able to ambulate at baseline.  Given resources for PCP and dentistry.  Discussed chronic pain options.  Will refill short course percocet, penicillin, and viscous lidocaine. Return precautions discussed.  Stable for discharge.   Final Clinical Impressions(s) / ED Diagnoses    Final diagnoses:  Pain, dental  Other chronic pain  Fall, initial encounter  Neck pain  Chronic low back pain, unspecified back pain laterality, with sciatica presence unspecified    New Prescriptions New Prescriptions   LIDOCAINE (XYLOCAINE) 2 % SOLUTION    Use as directed 15 mLs in the mouth or throat as needed for mouth pain.   OXYCODONE-ACETAMINOPHEN (PERCOCET) 10-325 MG TABLET    Take 1 tablet by mouth every 4 (four) hours as needed for pain.   PENICILLIN V POTASSIUM (VEETID) 500 MG TABLET    Take 1 tablet (500 mg total) by mouth 4 (four) times daily.     Cheri Fowler, PA-C 09/09/16 1512    Lavera Guise, MD 09/09/16 [phone]

## 2016-09-09 NOTE — ED Notes (Addendum)
Patient standing outside of room with her cane. Writer informed that the patient's husband called the ED demanding why his wife had not been seen.
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
## 2016-09-09 NOTE — ED Notes (Signed)
PA at bedside.

## 2016-09-09 NOTE — ED Notes (Signed)
Patient continues to c/o dental pain. States the pain medicine did not help. Patient requesting lidocaine.

## 2016-09-09 NOTE — ED Notes (Signed)
Patient transported to CT 

## 2016-09-09 NOTE — ED Notes (Signed)
PT went to restroom before going to CT did not get urine sample.
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
## 2016-09-09 NOTE — Discharge Instructions (Signed)
Please follow up with a Dentist for further evaluation and management.   °Call [phone] for Emergent Dental Care ° °Check this website for free, low-income or sliding scale dental services in [place]. www.[email or web address].us   °To find a dentist in the [place] or surrounding areas check this website: [email or web address] ° °

## 2016-09-09 NOTE — Progress Notes (Signed)
Pt informed CM she needed pain med CM updtaed ED RN Cm provided pt with list of medicaid [place] dentist and copy of [place] medicaid instructions for medicaid dental care plus handout on community health response program

## 2017-04-02 ENCOUNTER — Emergency Department (HOSPITAL_BASED_OUTPATIENT_CLINIC_OR_DEPARTMENT_OTHER)
Admission: EM | Admit: 2017-04-02 | Discharge: 2017-04-02 | Disposition: A | Discharge status: Home / Self Care | Payer: Medicaid Other | Attending: Dermatology | Admitting: Dermatology

## 2017-04-02 ENCOUNTER — Encounter (HOSPITAL_BASED_OUTPATIENT_CLINIC_OR_DEPARTMENT_OTHER): Payer: Self-pay | Admitting: *Deleted

## 2017-04-02 DIAGNOSIS — F17210 Nicotine dependence, cigarettes, uncomplicated: Secondary | ICD-10-CM | POA: Insufficient documentation

## 2017-04-02 DIAGNOSIS — Z5321 Procedure and treatment not carried out due to patient leaving prior to being seen by health care provider: Secondary | ICD-10-CM | POA: Insufficient documentation

## 2017-04-02 DIAGNOSIS — M25512 Pain in left shoulder: Secondary | ICD-10-CM | POA: Diagnosis present

## 2017-04-02 HISTORY — DX: Other chronic pain: G89.29

## 2017-04-02 HISTORY — DX: Dorsalgia, unspecified: M54.9

## 2017-04-02 HISTORY — DX: Anxiety disorder, unspecified: F41.9

## 2017-04-02 NOTE — ED Triage Notes (Addendum)
Pt c/o left shoulder pain and numbness, sob x 1 week,  increased stress x 1 week

## 2017-07-02 ENCOUNTER — Encounter (HOSPITAL_COMMUNITY): Payer: Self-pay

## 2017-07-02 ENCOUNTER — Emergency Department (HOSPITAL_COMMUNITY): Payer: Medicaid Other

## 2017-07-02 ENCOUNTER — Emergency Department (HOSPITAL_COMMUNITY)
Admission: EM | Admit: 2017-07-02 | Discharge: 2017-07-02 | Discharge status: Against Medical Advice | Payer: Medicaid Other | Attending: Emergency Medicine | Admitting: Emergency Medicine

## 2017-07-02 DIAGNOSIS — R1031 Right lower quadrant pain: Secondary | ICD-10-CM | POA: Insufficient documentation

## 2017-07-02 DIAGNOSIS — R102 Pelvic and perineal pain: Secondary | ICD-10-CM | POA: Insufficient documentation

## 2017-07-02 DIAGNOSIS — F17210 Nicotine dependence, cigarettes, uncomplicated: Secondary | ICD-10-CM | POA: Diagnosis not present

## 2017-07-02 LAB — COMPREHENSIVE METABOLIC PANEL
ALT: 25 U/L (ref 14–54)
ANION GAP: 7 (ref 5–15)
AST: 23 U/L (ref 15–41)
Albumin: 4 g/dL (ref 3.5–5.0)
Alkaline Phosphatase: 51 U/L (ref 38–126)
BUN: 8 mg/dL (ref 6–20)
CHLORIDE: 105 mmol/L (ref 101–111)
CO2: 25 mmol/L (ref 22–32)
Calcium: 9 mg/dL (ref 8.9–10.3)
Creatinine, Ser: 0.76 mg/dL (ref 0.44–1.00)
Glucose, Bld: 99 mg/dL (ref 65–99)
POTASSIUM: 4.4 mmol/L (ref 3.5–5.1)
Sodium: 137 mmol/L (ref 135–145)
TOTAL PROTEIN: 7.5 g/dL (ref 6.5–8.1)
Total Bilirubin: 0.5 mg/dL (ref 0.3–1.2)

## 2017-07-02 LAB — CBC
HEMATOCRIT: 42.3 % (ref 36.0–46.0)
Hemoglobin: 14.6 g/dL (ref 12.0–15.0)
MCH: 29.3 pg (ref 26.0–34.0)
MCHC: 34.5 g/dL (ref 30.0–36.0)
MCV: 84.8 fL (ref 78.0–100.0)
Platelets: 255 10*3/uL (ref 150–400)
RBC: 4.99 MIL/uL (ref 3.87–5.11)
RDW: 13.2 % (ref 11.5–15.5)
WBC: 9.8 10*3/uL (ref 4.0–10.5)

## 2017-07-02 LAB — I-STAT BETA HCG BLOOD, ED (MC, WL, AP ONLY): I-stat hCG, quantitative: <5 m[IU]/mL (ref <5)

## 2017-07-02 LAB — URINALYSIS, ROUTINE W REFLEX MICROSCOPIC
Bilirubin Urine: NEGATIVE
GLUCOSE, UA: NEGATIVE mg/dL
Hgb urine dipstick: NEGATIVE
Ketones, ur: NEGATIVE mg/dL
LEUKOCYTES UA: NEGATIVE
NITRITE: NEGATIVE
PH: 5 (ref 5.0–8.0)
PROTEIN: NEGATIVE mg/dL
Specific Gravity, Urine: 1.021 (ref 1.005–1.030)

## 2017-07-02 LAB — LIPASE, BLOOD: LIPASE: 36 U/L (ref 11–51)

## 2017-07-02 MED ORDER — MORPHINE SULFATE (PF) 4 MG/ML IV SOLN
4.0000 mg | Freq: Once | INTRAVENOUS | Status: AC
  Administered 2017-07-02: 4 mg via INTRAVENOUS
  Filled 2017-07-02: qty 1

## 2017-07-02 MED ORDER — SODIUM CHLORIDE 0.9 % IV BOLUS (SEPSIS)
1000.0000 mL | Freq: Once | INTRAVENOUS | Status: AC
  Administered 2017-07-02: 1000 mL via INTRAVENOUS

## 2017-07-02 MED ORDER — ONDANSETRON HCL 4 MG/2ML IJ SOLN
4.0000 mg | Freq: Once | INTRAMUSCULAR | Status: AC
  Administered 2017-07-02: 4 mg via INTRAVENOUS
  Filled 2017-07-02: qty 2

## 2017-07-02 NOTE — ED Triage Notes (Signed)
Onset suddenly 2 hours ago RLQ abd pain.  No other symptoms.  Last BM this morning and was normal.

## 2017-07-02 NOTE — ED Notes (Signed)
Patient transported to Ultrasound 

## 2017-07-02 NOTE — ED Provider Notes (Signed)
[facility]-EMERGENCY DEPT Provider Note   CSN: [id number] Arrival date & time: 07/02/17  1923     History   Chief Complaint Chief Complaint  Patient presents with  . Abdominal Pain    HPI Abigail Nixon is a 36 y.o. female.  Abigail Nixon is a 36 y.o. Female who presents to the emergency department complaining of sudden onset of right lower quadrant abdominal pain starting about 3 hours ago. Patient reports she was sitting when she had sudden onset of right lower quadrant belly pain. She reports her pain has been constant and sometimes fluctuate in intensity. She denies any back pain or flank pain. No difficulty urinating or history of kidney stones. Patient reports she's previously had an ovary removed and believes this is her left one. She is unsure. She also reports tubal ligation. No other abdominal surgeries. Last menstrual cycle was 2 weeks ago. She denies fevers, nausea, vomiting, diarrhea, urinary symptoms, hematuria, difficulty urinating, flank pain, history of kidney stones, chest pain, shortness of breath, vaginal bleeding, vaginal discharge, or rashes.   The history is provided by the patient and medical records. No language interpreter was used.  Abdominal Pain   Pertinent negatives include fever, diarrhea, nausea, vomiting, constipation, dysuria, frequency, hematuria and headaches.    Past Medical History:  Diagnosis Date  . Anxiety   . Chronic back pain   . DDD (degenerative disc disease), lumbar   . Migraine     There are no active problems to display for this patient.   Past Surgical History:  Procedure Laterality Date  . BACK SURGERY    . TUBAL LIGATION      OB History    No data available       Home Medications    Prior to Admission medications   Medication Sig Start Date End Date Taking? Authorizing Provider  albuterol (PROVENTIL HFA;VENTOLIN HFA) 108 (90 Base) MCG/ACT inhaler Inhale 1-2 puffs into the lungs every 6 (six) hours as needed for wheezing or  shortness of breath.    Yes [provider]  albuterol (PROVENTIL) (2.5 MG/3ML) 0.083% nebulizer solution Take 2.5 mg by nebulization every 6 (six) hours as needed for wheezing or shortness of breath.   Yes [provider]  naproxen sodium (ALEVE) 220 MG tablet Take 440 mg by mouth 2 (two) times daily as needed.   Yes [provider]  oxyCODONE-acetaminophen (PERCOCET) 10-325 MG tablet Take 1 tablet by mouth every 4 (four) hours as needed for pain. 09/09/16  Yes Cheri Fowlerose, Kayla, PA-C    Family History History reviewed. No pertinent family history.  Social History Social History  Substance Use Topics  . Smoking status: Current Every Day Smoker    Packs/day: 0.50    Types: Cigarettes  . Smokeless tobacco: Never Used  . Alcohol use No     Allergies   Patient has no known allergies.   Review of Systems Review of Systems  Constitutional: Negative for chills and fever.  HENT: Negative for congestion and sore throat.   Eyes: Negative for visual disturbance.  Respiratory: Negative for cough, shortness of breath and wheezing.   Cardiovascular: Negative for chest pain.  Gastrointestinal: Positive for abdominal pain. Negative for constipation, diarrhea, nausea and vomiting.  Genitourinary: Negative for decreased urine volume, difficulty urinating, dysuria, flank pain, frequency, hematuria, menstrual problem, urgency, vaginal bleeding and vaginal discharge.  Musculoskeletal: Negative for back pain and neck pain.  Skin: Negative for rash.  Neurological: Negative for headaches.     Physical  Exam Updated Vital Signs BP 136/77   Pulse 71   Temp 98.3 F (36.8 C) (Oral)   Resp 20   Ht 5\' 6"  (1.676 m)   Wt 108.9 kg (240 lb)   LMP 06/19/2017   SpO2 97%   BMI 38.74 kg/m   Physical Exam  Constitutional: She appears well-developed and well-nourished. No distress.  Nontoxic appearing.  HENT:  Head: Normocephalic and atraumatic.  Mouth/Throat: Oropharynx is  clear and moist.  Eyes: Pupils are equal, round, and reactive to light. Conjunctivae are normal. Right eye exhibits no discharge. Left eye exhibits no discharge.  Neck: Neck supple.  Cardiovascular: Normal rate, regular rhythm, normal heart sounds and intact distal pulses.  Exam reveals no gallop and no friction rub.   No murmur heard. Pulmonary/Chest: Effort normal and breath sounds normal. No respiratory distress. She has no wheezes. She has no rales.  Abdominal: Soft. Bowel sounds are normal. She exhibits no distension and no mass. There is tenderness. There is no rebound and no guarding.  Abdomen soft. Bowel sounds are present. Patient has right adnexal tenderness to palpation. No Rovsing sign. No CVA or flank tenderness.  Musculoskeletal: She exhibits no edema.  Lymphadenopathy:    She has no cervical adenopathy.  Neurological: She is alert. Coordination normal.  Skin: Skin is warm and dry. No rash noted. She is not diaphoretic. No erythema. No pallor.  Psychiatric: She has a normal mood and affect. Her behavior is normal.  Nursing note and vitals reviewed.    ED Treatments / Results  Labs (all labs ordered are listed, but only abnormal results are displayed) Labs Reviewed  LIPASE, BLOOD  COMPREHENSIVE METABOLIC PANEL  CBC  URINALYSIS, ROUTINE W REFLEX MICROSCOPIC  I-STAT BETA HCG BLOOD, ED ([facility], [facility], [facility] ONLY)    EKG  EKG Interpretation None       Radiology US Pelvis Complete  Result Date: 07/02/2017 CLINICAL DATA:  36 y/o F; several hours of right lower quadrant pain. Left oophorectomy per patient. EXAM: TRANSABDOMINAL ULTRASOUND OF PELVIS DOPPLER ULTRASOUND OF OVARIES TECHNIQUE: Transabdominal ultrasound examination of the pelvis was performed including evaluation of the uterus, ovaries, adnexal regions, and pelvic cul-de-sac. Color and duplex Doppler ultrasound was utilized to evaluate blood flow to the ovaries. COMPARISON:  None. FINDINGS: Uterus Measurements: 8.2 x 4.6  x 5.6 cm. No fibroids or other mass visualized. Endometrium Thickness: 7 mm. Limited visibility, no gross abnormality. Right ovary Measurements: 3.7 x 2.3 x 2.8 cm. Simple appearing ovarian follicles measuring up to 1.4 cm. Left ovary Not visualized. Pulsed Doppler evaluation demonstrates normal low-resistance arterial and venous waveforms in both ovaries. IMPRESSION: No acute process identified. Unremarkable pelvic ultrasound and Doppler. Electronically Signed   By: Mitzi Hansen M.D.   On: 07/02/2017 22:11   [place] Art/ven Flow Abd Pelv Doppler  Result Date: 07/02/2017 CLINICAL DATA:  36 y/o F; several hours of right lower quadrant pain. Left oophorectomy per patient. EXAM: TRANSABDOMINAL ULTRASOUND OF PELVIS DOPPLER ULTRASOUND OF OVARIES TECHNIQUE: Transabdominal ultrasound examination of the pelvis was performed including evaluation of the uterus, ovaries, adnexal regions, and pelvic cul-de-sac. Color and duplex Doppler ultrasound was utilized to evaluate blood flow to the ovaries. COMPARISON:  None. FINDINGS: Uterus Measurements: 8.2 x 4.6 x 5.6 cm. No fibroids or other mass visualized. Endometrium Thickness: 7 mm. Limited visibility, no gross abnormality. Right ovary Measurements: 3.7 x 2.3 x 2.8 cm. Simple appearing ovarian follicles measuring up to 1.4 cm. Left ovary Not visualized. Pulsed Doppler evaluation demonstrates normal  low-resistance arterial and venous waveforms in both ovaries. IMPRESSION: No acute process identified. Unremarkable pelvic ultrasound and Doppler. Electronically Signed   By: Mitzi Hansen M.D.   On: 07/02/2017 22:11    Procedures Procedures (including critical care time)  Medications Ordered in ED Medications  sodium chloride 0.9 % bolus 1,000 mL (0 mLs Intravenous Stopped 07/02/17 2227)  ondansetron (ZOFRAN) injection 4 mg (4 mg Intravenous Given 07/02/17 2111)  morphine 4 MG/ML injection 4 mg (4 mg Intravenous Given 07/02/17 2111)  morphine 4 MG/ML  injection 4 mg (4 mg Intravenous Given 07/02/17 2227)     Initial Impression / Assessment and Plan / ED Course  I have reviewed the triage vital signs and the nursing notes.  Pertinent labs & imaging results that were available during my care of the patient were reviewed by me and considered in my medical decision making (see chart for details).    This is a 36 y.o. Female who presents to the emergency department complaining of sudden onset of right lower quadrant abdominal pain starting about 3 hours ago. Patient reports she was sitting when she had sudden onset of right lower quadrant belly pain. She reports her pain has been constant and sometimes fluctuate in intensity. She denies any back pain or flank pain. No difficulty urinating or history of kidney stones. Patient reports she's previously had an ovary removed and believes this is her left one. She is unsure. She also reports tubal ligation. No other abdominal surgeries. Last menstrual cycle was 2 weeks ago. She denies fevers, nausea, vomiting, diarrhea, urinary symptoms, hematuria, difficulty urinating, flank pain, history of kidney stones.  On exam the patient is afebrile nontoxic appearing. Her abdomen is soft and she has right adnexal tenderness to palpation. No Rovsing sign. No psoas or obturator sign. Pregnancy test is negative. Urinalysis without signs of infection. No hematuria. Lipase is within normal limits. CBC and CMP are within normal limits. Based on her history complaining of sudden onset of right adnexal pain advised plan to obtain ultrasound study first to rule out torsion. Will provide with pain and nausea medication. Advised that this is unremarkable we will likely need CT abdomen and pelvis to rule out appendicitis and/or kidney stone. Patient agrees with plan. Pelvic ultrasound is unremarkable. Reevaluation patient reports she still having some pain. I discussed test results so far. I advised plan for CT scan. Patient  agrees with plan. I also discussed that I would like to do a pelvic exam as the patient is having adnexal pain on my exam. Patient refuses pelvic exam. I discussed the importance of doing a pelvic exam and to rule out such as pelvic infections. Patient still declines. She tells me she will go to her GYN after she leaves the emergency department. Later, I was called to bedside by RN. Patient wanting to go home. Patient is tearful and tells me she is hungry and her family is hungry and tired. She does not want to wait for any further imaging or testing. She still declines pelvic exam. She tells me she wants to go home. I advised that this is against my advice and I felt she should have further workup for her sudden onset of abdominal pain. Patient refuses. She will leave AGAINST MEDICAL ADVICE. I discussed return precautions. I encouraged her to follow up with her PCP.   Final Clinical Impressions(s) / ED Diagnoses   Final diagnoses:  Right lower quadrant abdominal pain  Pelvic pain in female  New Prescriptions New Prescriptions   No medications on file     Lorene Dy 07/02/17 2346    Loren Racer, MD 07/03/17 [id number]

## 2017-07-03 NOTE — ED Notes (Signed)
Pts husband stopped RN while walking down the hall stating "he wanted this shit off his wife and that they were leaving". RN stepped into the room to see what the issue was and the family started arguing with each other. RN tried to explain delay in care but husband interrupted. RN stepped out and notified provider. RN stepped in room with provider while he explained plan of care and the risks of leaving. Pt and family stated they were going to leave and signed the [facility]

## 2017-09-14 ENCOUNTER — Telehealth: Payer: Self-pay | Admitting: *Deleted

## 2017-09-14 ENCOUNTER — Other Ambulatory Visit: Payer: Self-pay | Admitting: Orthopedic Surgery

## 2017-09-14 ENCOUNTER — Ambulatory Visit: Payer: Medicaid Other | Admitting: Neurology

## 2017-09-14 DIAGNOSIS — M5416 Radiculopathy, lumbar region: Secondary | ICD-10-CM

## 2017-09-14 DIAGNOSIS — M5412 Radiculopathy, cervical region: Secondary | ICD-10-CM

## 2017-09-14 NOTE — Telephone Encounter (Signed)
Patient no show new pt appt on 09/14/2017

## 2017-09-25 ENCOUNTER — Ambulatory Visit
Admission: RE | Admit: 2017-09-25 | Discharge: 2017-09-25 | Disposition: A | Discharge status: Home / Self Care | Payer: Medicaid Other | Source: Ambulatory Visit | Attending: Orthopedic Surgery | Admitting: Orthopedic Surgery

## 2017-09-25 DIAGNOSIS — M5412 Radiculopathy, cervical region: Secondary | ICD-10-CM

## 2017-09-25 DIAGNOSIS — M5416 Radiculopathy, lumbar region: Secondary | ICD-10-CM

## 2017-12-07 ENCOUNTER — Emergency Department (HOSPITAL_COMMUNITY)
Admission: EM | Admit: 2017-12-07 | Discharge: 2017-12-07 | Discharge status: Against Medical Advice | Payer: Medicaid Other

## 2018-05-13 ENCOUNTER — Encounter

## 2018-05-20 ENCOUNTER — Other Ambulatory Visit: Payer: Self-pay

## 2018-05-20 ENCOUNTER — Emergency Department (HOSPITAL_COMMUNITY)
Admission: EM | Admit: 2018-05-20 | Discharge: 2018-05-20 | Disposition: A | Discharge status: Home / Self Care | Payer: Medicaid Other | Attending: Emergency Medicine | Admitting: Emergency Medicine

## 2018-05-20 ENCOUNTER — Encounter (HOSPITAL_COMMUNITY): Payer: Self-pay | Admitting: Emergency Medicine

## 2018-05-20 DIAGNOSIS — L02412 Cutaneous abscess of left axilla: Secondary | ICD-10-CM | POA: Insufficient documentation

## 2018-05-20 DIAGNOSIS — F17210 Nicotine dependence, cigarettes, uncomplicated: Secondary | ICD-10-CM | POA: Diagnosis not present

## 2018-05-20 DIAGNOSIS — Z760 Encounter for issue of repeat prescription: Secondary | ICD-10-CM | POA: Diagnosis not present

## 2018-05-20 DIAGNOSIS — B354 Tinea corporis: Secondary | ICD-10-CM | POA: Diagnosis not present

## 2018-05-20 DIAGNOSIS — L748 Other eccrine sweat disorders: Secondary | ICD-10-CM

## 2018-05-20 MED ORDER — OXYCODONE-ACETAMINOPHEN 5-325 MG PO TABS
2.0000 | ORAL_TABLET | Freq: Once | ORAL | Status: AC
  Administered 2018-05-20: 2 via ORAL
  Filled 2018-05-20: qty 2

## 2018-05-20 MED ORDER — ALBUTEROL SULFATE HFA 108 (90 BASE) MCG/ACT IN AERS
1.0000 | INHALATION_SPRAY | Freq: Four times a day (QID) | RESPIRATORY_TRACT | Status: DC | PRN
  Administered 2018-05-20: 2 via RESPIRATORY_TRACT
  Filled 2018-05-20 (×2): qty 6.7

## 2018-05-20 MED ORDER — NAPROXEN 500 MG PO TABS: 500.0000 mg | ORAL_TABLET | Freq: Two times a day (BID) | ORAL | 0 refills | Status: AC

## 2018-05-20 MED ORDER — DOXYCYCLINE HYCLATE 100 MG PO CAPS: 100.0000 mg | ORAL_CAPSULE | Freq: Two times a day (BID) | ORAL | 0 refills | Status: AC

## 2018-05-20 MED ORDER — CLOTRIMAZOLE 1 % EX CREA: TOPICAL_CREAM | CUTANEOUS | 0 refills | Status: AC

## 2018-05-20 NOTE — ED Notes (Signed)
Albuterol MDI given to patient for PRN use.  Patient does not need tx at this time.  Patient familiar with use, has no questions.

## 2018-05-20 NOTE — ED Provider Notes (Signed)
[facility] EMERGENCY DEPARTMENT Provider Note   CSN: [id number] Arrival date & time: 05/20/18  1949     History   Chief Complaint Chief Complaint  Patient presents with  . Abscess    HPI Abigail Nixon is a 37 y.o. female with a hx of anxiety, chronic back pain, migraines, tubal ligation, and history of abscesses in multiple locations who presents to the ED with multiple complaints. Primary concern is abscesses to L axilla x 1 week and likely abscess to abdomen x 4 days. Patient states she has multiple painful red areas to her L axillary region, started as 1-2, progressively developed more, some have had drainage. She also developed a somewhat painful red area to the left lower abdomen 4 days ago, she has expressed drainage from this as well, she queried insect bite but did not witness a bite. She has not had deeper type abdominal pain, feels very topical per patient. Pain overall is a 10/10 in severity mostly to axilla. No specific alleviating/aggravating factors, she was unable to refill her oxycodone due to an insurance problem, sees pain management, she is not requesting refill . Denies fever, chills, nausea, vomiting, or diarrhea. She also mentions itchy "dry" areas of skin to her L arm and to her R abdomen which [place] She is also requesting refill of her ventolin inhaler in the ER. Denies dyspnea, wheezing, or chest pain, just has run out.   HPI  Past Medical History:  Diagnosis Date  . Anxiety   . Chronic back pain   . DDD (degenerative disc disease), lumbar   . Migraine     There are no active problems to display for this patient.   Past Surgical History:  Procedure Laterality Date  . BACK SURGERY    . TUBAL LIGATION       OB History   None      Home Medications    Prior to Admission medications   Medication Sig Start Date End Date Taking? Authorizing Provider  albuterol (PROVENTIL HFA;VENTOLIN HFA) 108  (90 Base) MCG/ACT inhaler Inhale 1-2 puffs into the lungs every 6 (six) hours as needed for wheezing or shortness of breath.     [provider]  albuterol (PROVENTIL) (2.5 MG/3ML) 0.083% nebulizer solution Take 2.5 mg by nebulization every 6 (six) hours as needed for wheezing or shortness of breath.    [provider]  naproxen sodium (ALEVE) 220 MG tablet Take 440 mg by mouth 2 (two) times daily as needed.    [provider]  oxyCODONE-acetaminophen (PERCOCET) 10-325 MG tablet Take 1 tablet by mouth every 4 (four) hours as needed for pain. 09/09/16   Cheri Fowler, PA-C    Family History History reviewed. No pertinent family history.  Social History Social History   Tobacco Use  . Smoking status: Current Every Day Smoker    Packs/day: 0.50    Types: Cigarettes  . Smokeless tobacco: Never Used  Substance Use Topics  . Alcohol use: No  . Drug use: No     Allergies   Patient has no known allergies.   Review of Systems Review of Systems  Constitutional: Negative for appetite change, chills and fever.  Respiratory: Negative for cough, shortness of breath and wheezing.   Cardiovascular: Negative for chest pain.  Gastrointestinal: Negative for abdominal pain, blood in stool, constipation, diarrhea, nausea and vomiting.  Skin: Positive for rash.       Positive  for multiple abscesses  Neurological: Negative for weakness and numbness.  All other systems reviewed and are negative.    Physical Exam Updated Vital Signs BP (!) 173/103 (BP Location: Right Wrist)   Pulse 92   Temp 98.9 F (37.2 C) (Oral)   Resp 18   SpO2 96%   Physical Exam  Constitutional: She appears well-developed and well-nourished.  Non-toxic appearance. She appears distressed (Mild secondary to pain).  HENT:  Head: Normocephalic and atraumatic.  Eyes: Conjunctivae are normal. Right eye exhibits no discharge. Left eye exhibits no discharge.  Cardiovascular: Normal rate and regular  rhythm.  No murmur heard. Pulses:      Radial pulses are 2+ on the right side, and 2+ on the left side.  Pulmonary/Chest: Effort normal and breath sounds normal. No respiratory distress. She has no wheezes. She has no rales.  Abdominal: Soft. She exhibits no distension. There is no tenderness. There is no rigidity, no rebound and no guarding.  Musculoskeletal:  Normal range of motion to upper extremities.  Neurological: She is alert.  Clear speech.   Skin: Skin is warm and dry.  Left axilla: Patient has 8 areas of varying size (max 2 cm in diameter) that are erythematous, mildly warm to touch. A few of these areas have palpable fluctuance, majority feel indurated.  Areas are tender to palpation.  No streaking erythema.  L arm: inner aspect of upper arm with erythematous scaly patch (3cm diameter) Abdomen: Right lower abdomen with area of induration (1.5cm) with central small scab, also with surrounding erythema, somewhat warm to touch, no palpable fluctuance.  Nontender to palpation.  Left lower abdomen with 5cm diameter erythematous scaly patch with annular border.   Psychiatric: She has a normal mood and affect. Her behavior is normal.  Nursing note and vitals reviewed.    ED Treatments / Results  Labs (all labs ordered are listed, but only abnormal results are displayed) Labs Reviewed - No data to display  EKG None  Radiology No results found.  Procedures Procedures (including critical care time) EMERGENCY DEPARTMENT US SOFT TISSUE INTERPRETATION "Study: Limited Soft Tissue Ultrasound"  INDICATIONS: Soft tissue infection Multiple views of the body part were obtained in real-time with a multi-frequency linear probe  PERFORMED BY: Myself IMAGES ARCHIVED?: No SIDE:Left BODY PART:Abdominal wall INTERPRETATION:  No abcess noted, cellulitic type changes superficially.   Medications Ordered in ED Medications  albuterol (PROVENTIL HFA;VENTOLIN HFA) 108 (90 Base) MCG/ACT  inhaler 1-2 puff (has no administration in time range)  oxyCODONE-acetaminophen (PERCOCET/ROXICET) 5-325 MG per tablet 2 tablet (has no administration in time range)     Initial Impression / Assessment and Plan / ED Course  I have reviewed the triage vital signs and the nursing notes.  Pertinent labs & imaging results that were available during my care of the patient were reviewed by me and considered in my medical decision making (see chart for details).   Patient presents to the emergency department with multiple complaints today: Patient nontoxic-appearing, appears mildly uncomfortable, afebrile, elevated BP possibly related to pain, doubt HTN emergency, patient aware of need for recheck.  -Left axillary abscesses: Patient has multiple abscesses, there are areas of palpable fluctuance, there are also areas of induration, no streaking type erythema. I offered and encouraged bedside ultrasound and subsequent I&D of abscesses if amenable- patient adamantly against this, she does not wish to have I&D performed as she has had these in the past and are very painful for her. I discussed risks/benefits/alternatives with  the patient and after lengthy discussion she wishes to trial abx. Will start doxycycline, recommended warm compresses.  - L lower abdominal erythema/induration- bedside ultrasound performed, no identifiable abscess, does not appear to be deep space infection, appears consistent with fairly superficial cellulitis- doxy as above. Abdomen nontender, no peritoneal signs.  - Rash to L arm /abdomen- appears consistent with tinea corporis, will provide clotrimazole.  - Inhaler refill request- asymptomatic, clear lungs, no respiratory distress, refill provided. Patient requested given in ED because no insurance coverage for this.   Will give dose of pain prescribed pain meds in the ER. Recommended patient have close follow-up for recheck of areas and possible I&D if necessary. I discussed  treatment plan, need for follow-up, and return precautions with the patient. Provided opportunity for questions, patient confirmed understanding and is in agreement with plan.   Final Clinical Impressions(s) / ED Diagnoses   Final diagnoses:  Multiple sweat gland abscess  Tinea corporis  Medication refill    ED Discharge Orders        Ordered    doxycycline (VIBRAMYCIN) 100 MG capsule  2 times daily     05/20/18 2032    naproxen (NAPROSYN) 500 MG tablet  2 times daily     05/20/18 2032    clotrimazole (LOTRIMIN) 1 % cream     05/20/18 2032       Petrucelli, Pleas Koch, PA-C 05/20/18 2350    Loren Racer, MD 05/23/18 1535

## 2018-05-20 NOTE — ED Notes (Signed)
Patient Alert and oriented to baseline. Stable and ambulatory to baseline. Patient verbalized understanding of the discharge instructions.  Patient belongings were taken by the patient. Patient has a ride home with her friend and she will not be driving herself.

## 2018-05-20 NOTE — ED Triage Notes (Signed)
Pt presents to Ed for assessment of left armpit abscesses as well as a spot on her stomach (possible spider bite?).  Patient also in tears in triage stating she could not get prior authorization for her pain management medications this weekend.  Patient states abscesses noted x 4 days, with some purulent discharge.  Stomach one also discharged green pus.   Denies fevers and chills

## 2018-05-20 NOTE — ED Notes (Signed)
ED Provider at bedside. 

## 2018-05-20 NOTE — Discharge Instructions (Signed)
You were seen in the emergency department for multiple complaints.  We offered incision and drainage of the areas to your left armpit, you declined this, you may return for potential this procedure at any time.  The area to the left part of your belly does not appear to need incision and drainage.  We are starting you on doxycycline to help treat infection in each of these areas.  Doxycycline is an antibiotic, take it twice daily.   We additionally gave you a prescription for naproxen for pain. Naproxen is a nonsteroidal anti-inflammatory medication that will help with pain and swelling. Be sure to take this medication as prescribed with food, 1 pill every 12 hours,  It should be taken with food, as it can cause stomach upset, and more seriously, stomach bleeding. Do not take other nonsteroidal anti-inflammatory medications with this such as Advil, Motrin, or Aleve.   Regarding the patchy itchy areas to your left arm and to your right side of your belly.  We feel these are consistent with ringworm otherwise known as tinea corporis.  Please apply the topical clotrimazole cream 2 times per day to these areas until they have resolved.  We have prescribed you new medication(s) today. Discuss the medications prescribed today with your pharmacist as they can have adverse effects and interactions with your other medicines including over the counter and prescribed medications. Seek medical evaluation if you start to experience new or abnormal symptoms after taking one of these medicines, seek care immediately if you start to experience difficulty breathing, feeling of your throat closing, facial swelling, or rash as these could be indications of a more serious allergic reaction   We would like you to follow-up closely with your primary care provider in 2 days for reevaluation of these problems.  Would also like your blood pressure rechecked at that time as it was elevated in the emergency department today.  Return  to the ER anytime for new or worsening symptoms including but not limited to fever, vomiting, abdominal pain, worsening redness, worsening pain, or any other concerns.

## 2018-05-20 NOTE — ED Notes (Signed)
Patient is A&Ox4 at this time.  Patient in no signs of distress.  Please see providers note for complete history and physical exam.  

## 2019-05-01 IMAGING — MR MR CERVICAL SPINE W/O CM
5 series · 28 of 48 positions shown · non-contrast
Comparison: Prior CT from 09/09/2016.

CLINICAL DATA: Initial evaluation for neck pain with posterior
headaches, radiating into left arm.

EXAM:
MRI CERVICAL SPINE WITHOUT CONTRAST
TECHNIQUE: Multiplanar, multisequence MR imaging of the cervical spine was
performed. No intravenous contrast was administered.

[Series 3: T2 · sagittal · 3.0mm · 0.66mm/px · 6 of 12 slices shown (1 of 2)]
[im 1/12]
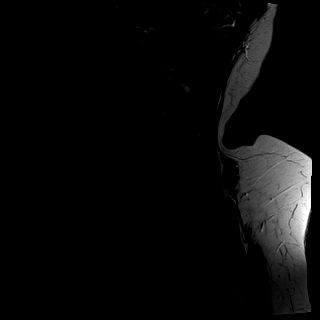
[im 3/12]
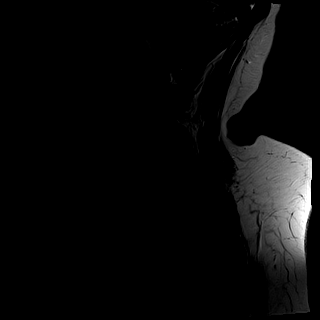
[im 5/12]
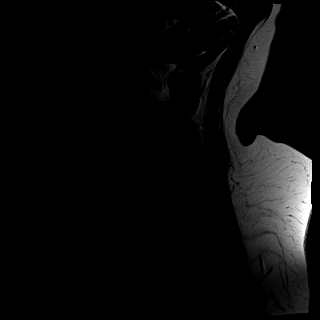
[im 7/12]
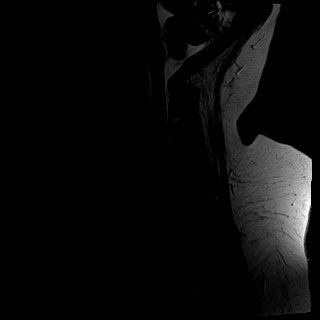
[im 9/12]
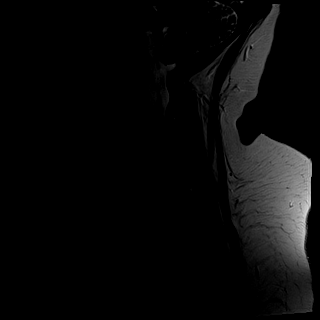
[im 12/12]
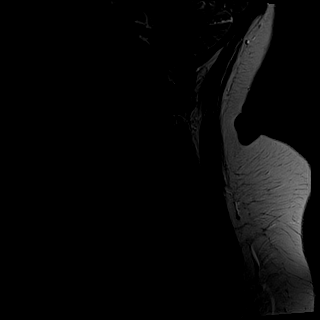

[Series 4: T1 · sagittal · 3.0mm · 0.41mm/px · 6 of 12 slices shown]
[im 1/12]
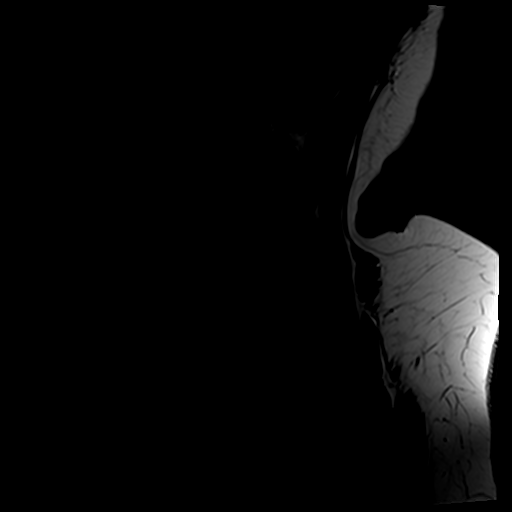
[im 3/12]
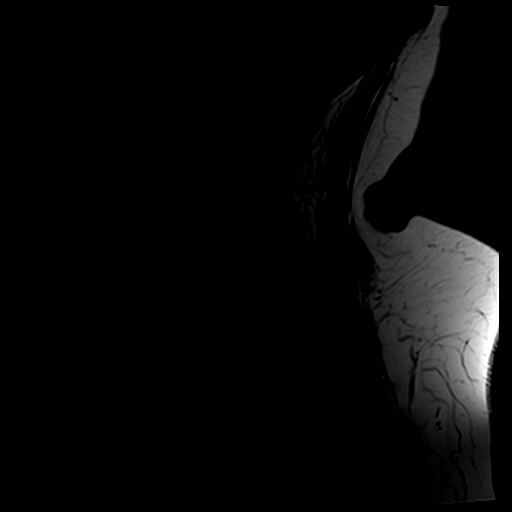
[im 5/12]
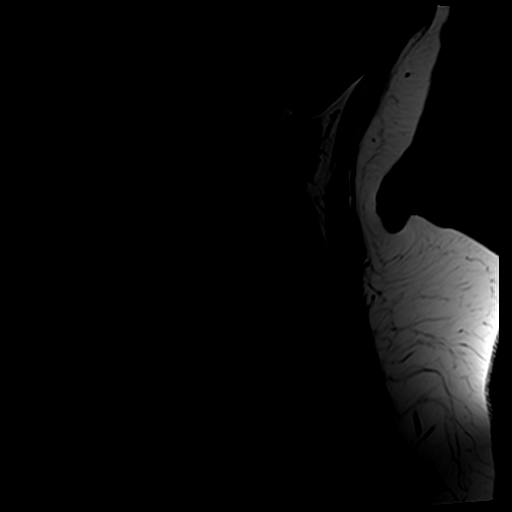
[im 7/12]
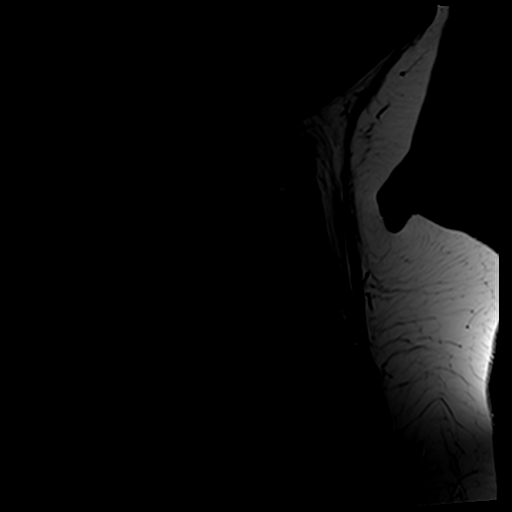
[im 9/12]
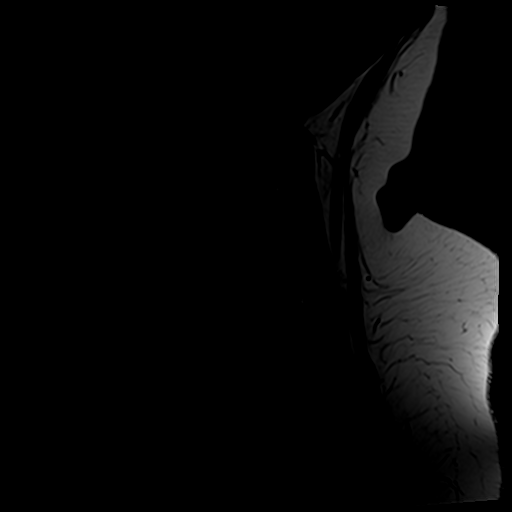
[im 12/12]
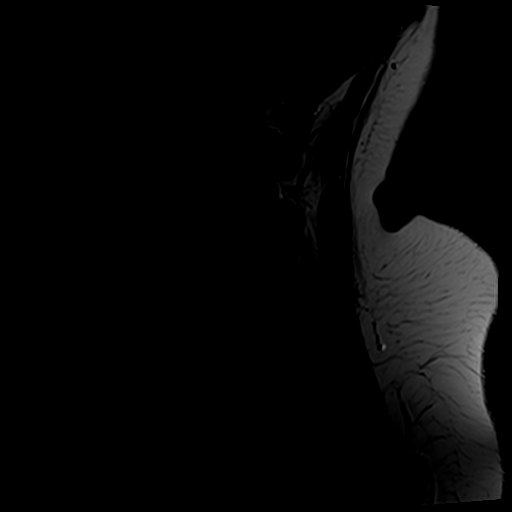

[Series 5: tir sag · sagittal · 3.0mm · 0.41mm/px · 6 of 12 slices shown]
[im 1/12]
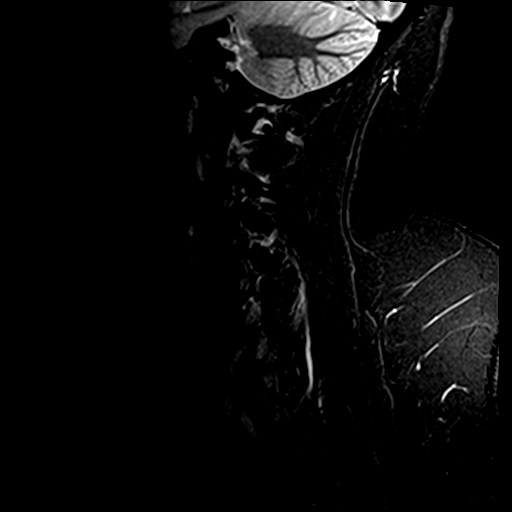
[im 3/12]
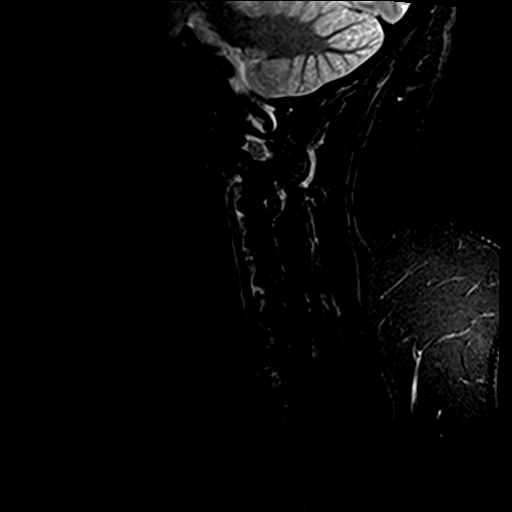
[im 5/12]
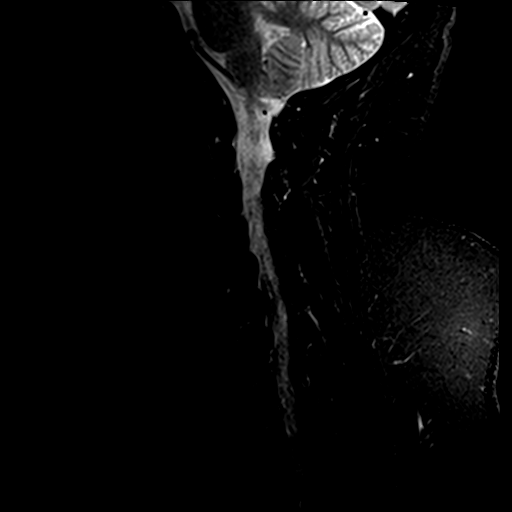
[im 7/12]
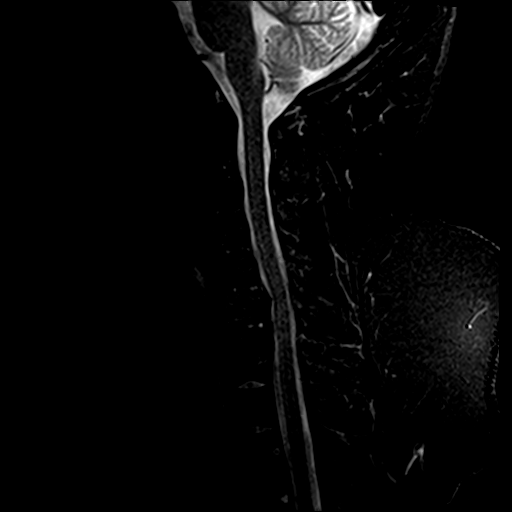
[im 9/12]
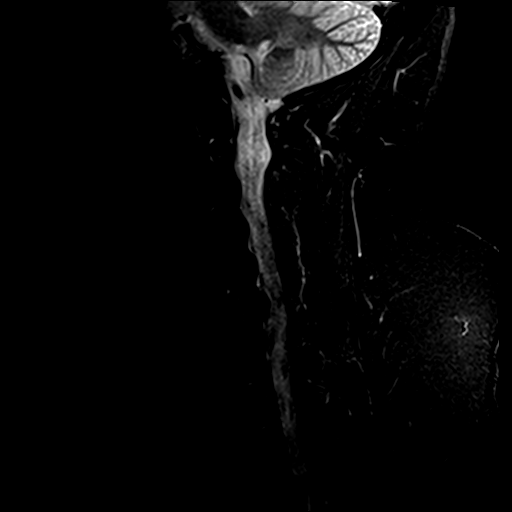
[im 12/12]
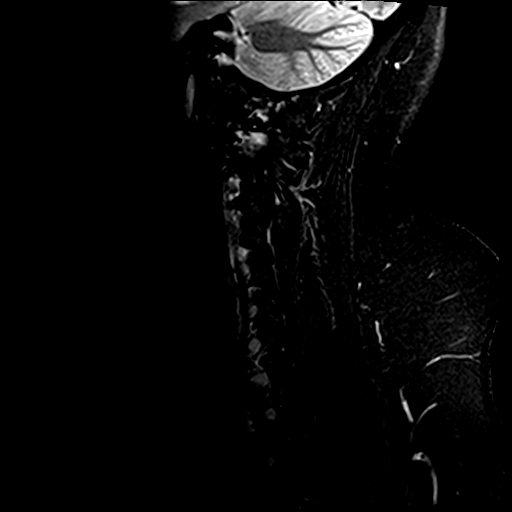

[Series 6: T2 · axial · 3.0mm · 0.70mm/px · z∈[-56,+42]mm · 9 of 28 slices shown (2 of 2)]
[im 1/28]
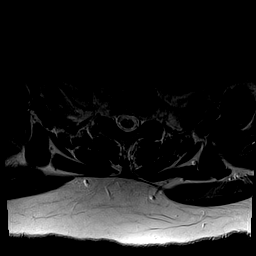
[im 4/28]
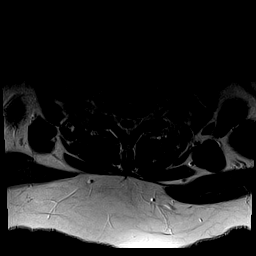
[im 8/28]
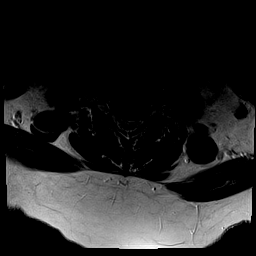
[im 12/28]
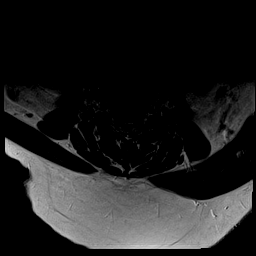
[im 14/28]
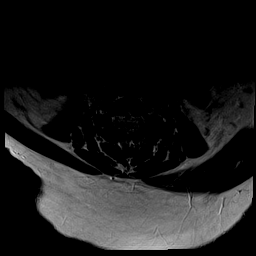
[im 16/28]
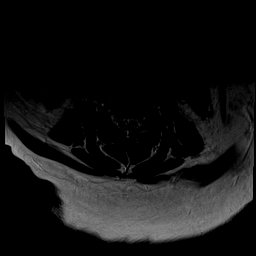
[im 20/28]
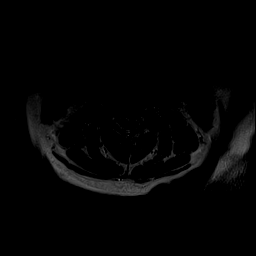
[im 24/28]
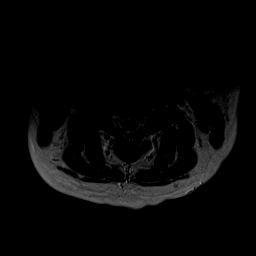
[im 28/28]
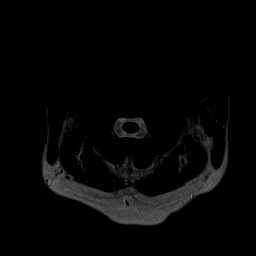

[Series 8: GRE · axial · 3.0mm · 0.35mm/px · 1 of 28 slices shown]
[im 1/28]
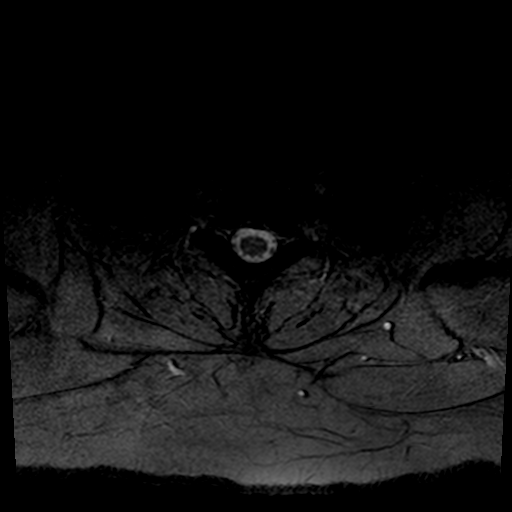

[28 of 48 positions shown; findings below may reference images not displayed]

FINDINGS: Alignment: Straightening of the normal cervical lordosis. No
listhesis.

Vertebrae: Vertebral body heights maintained. No evidence for acute
or chronic fracture. Bone marrow signal intensity within normal
limits. No discrete or worrisome osseous lesions. No abnormal marrow
edema.

Cord: Signal intensity within the cervical spinal cord is within
normal limits.

Posterior Fossa, vertebral arteries, paraspinal tissues: Visualized
brain and posterior fossa within normal limits. Craniocervical
junction normal. Paraspinous soft tissues within normal limits.
Normal intravascular flow voids present bilaterally.

Disc levels:

C2-C3: Unremarkable.

C3-C4:  Unremarkable.

C4-C5: Shallow posterior disc bulge. Mild flattening of the ventral
thecal sac without canal stenosis. No significant foraminal
encroachment.

C5-C6: Diffuse disc bulge with intervertebral disc space narrowing.
Bulging disc flattens and partially effaces the ventral thecal sac.
Mild cord flattening without cord signal changes. Mild spinal
stenosis. Bilateral uncovertebral spurring. Moderate right with mild
left C6 foraminal narrowing.

C6-C7: Diffuse disc bulge, eccentric to the left. Mild uncovertebral
spurring. Bulging disc indents the ventral thecal sac with resultant
mild spinal stenosis. Mild flattening of the left hemi cord without
cord signal changes. Moderate left with mild right C7 foraminal
narrowing.

C7-T1:  Unremarkable.

Upper thoracic spine within normal limits.
IMPRESSION: 1. Disc bulge with uncovertebral spurring at C5-6 with resultant
mild canal and mild-to-moderate bilateral C6 foraminal stenosis,
right worse than left.
2. Left eccentric disc bulge at C6-7 with secondary mild flattening
of the left hemi cord. Mild canal with moderate left C7 foraminal
stenosis.

## 2023-11-23 DEATH — deceased
